# Patient Record
Sex: Female | Born: 1945 | Race: White | Hispanic: No | Smoking: Never smoker
Health system: Southern US, Community
[De-identification: ages and names within clinical notes are randomized; demographics above are authoritative.]

---

## 1999-06-05 ENCOUNTER — Other Ambulatory Visit: Admission: RE | Admit: 1999-06-05 | Discharge: 1999-06-05 | Payer: Self-pay | Admitting: Obstetrics and Gynecology

## 2000-06-10 ENCOUNTER — Other Ambulatory Visit: Admission: RE | Admit: 2000-06-10 | Discharge: 2000-06-10 | Payer: Self-pay | Admitting: Obstetrics and Gynecology

## 2001-02-04 ENCOUNTER — Ambulatory Visit (HOSPITAL_COMMUNITY): Admission: RE | Admit: 2001-02-04 | Discharge: 2001-02-04 | Payer: Self-pay | Admitting: Gastroenterology

## 2001-07-29 ENCOUNTER — Other Ambulatory Visit: Admission: RE | Admit: 2001-07-29 | Discharge: 2001-07-29 | Payer: Self-pay | Admitting: Obstetrics and Gynecology

## 2001-09-02 ENCOUNTER — Encounter: Payer: Self-pay | Admitting: Obstetrics and Gynecology

## 2001-09-02 ENCOUNTER — Encounter (INDEPENDENT_AMBULATORY_CARE_PROVIDER_SITE_OTHER): Payer: Self-pay | Admitting: Specialist

## 2001-09-02 ENCOUNTER — Ambulatory Visit (HOSPITAL_COMMUNITY): Admission: RE | Admit: 2001-09-02 | Discharge: 2001-09-02 | Payer: Self-pay | Admitting: Radiology

## 2002-10-05 ENCOUNTER — Other Ambulatory Visit: Admission: RE | Admit: 2002-10-05 | Discharge: 2002-10-05 | Payer: Self-pay | Admitting: Obstetrics and Gynecology

## 2003-11-08 ENCOUNTER — Other Ambulatory Visit: Admission: RE | Admit: 2003-11-08 | Discharge: 2003-11-08 | Payer: Self-pay | Admitting: Obstetrics and Gynecology

## 2009-05-17 ENCOUNTER — Encounter: Admission: RE | Admit: 2009-05-17 | Discharge: 2009-05-17 | Payer: Self-pay | Admitting: Internal Medicine

## 2012-05-11 ENCOUNTER — Other Ambulatory Visit: Payer: Self-pay | Admitting: Internal Medicine

## 2012-05-11 DIAGNOSIS — Z1231 Encounter for screening mammogram for malignant neoplasm of breast: Secondary | ICD-10-CM

## 2012-06-15 ENCOUNTER — Ambulatory Visit
Admission: RE | Admit: 2012-06-15 | Discharge: 2012-06-15 | Disposition: A | Discharge status: Home / Self Care | Payer: Medicare Other | Source: Ambulatory Visit | Attending: Internal Medicine | Admitting: Internal Medicine

## 2012-06-15 DIAGNOSIS — Z1231 Encounter for screening mammogram for malignant neoplasm of breast: Secondary | ICD-10-CM

## 2012-09-03 ENCOUNTER — Ambulatory Visit (INDEPENDENT_AMBULATORY_CARE_PROVIDER_SITE_OTHER): Payer: No Typology Code available for payment source | Admitting: Licensed Clinical Social Worker

## 2012-09-03 DIAGNOSIS — F4321 Adjustment disorder with depressed mood: Secondary | ICD-10-CM

## 2012-09-09 ENCOUNTER — Ambulatory Visit: Payer: Medicare Other | Admitting: Licensed Clinical Social Worker

## 2012-09-14 ENCOUNTER — Ambulatory Visit (INDEPENDENT_AMBULATORY_CARE_PROVIDER_SITE_OTHER): Payer: No Typology Code available for payment source | Admitting: Licensed Clinical Social Worker

## 2012-09-14 DIAGNOSIS — F4321 Adjustment disorder with depressed mood: Secondary | ICD-10-CM

## 2012-09-28 ENCOUNTER — Ambulatory Visit (INDEPENDENT_AMBULATORY_CARE_PROVIDER_SITE_OTHER): Payer: No Typology Code available for payment source | Admitting: Licensed Clinical Social Worker

## 2012-09-28 DIAGNOSIS — F4321 Adjustment disorder with depressed mood: Secondary | ICD-10-CM

## 2012-10-26 ENCOUNTER — Ambulatory Visit (INDEPENDENT_AMBULATORY_CARE_PROVIDER_SITE_OTHER): Payer: No Typology Code available for payment source | Admitting: Licensed Clinical Social Worker

## 2012-10-26 DIAGNOSIS — F4321 Adjustment disorder with depressed mood: Secondary | ICD-10-CM

## 2012-11-23 ENCOUNTER — Ambulatory Visit (INDEPENDENT_AMBULATORY_CARE_PROVIDER_SITE_OTHER): Payer: No Typology Code available for payment source | Admitting: Licensed Clinical Social Worker

## 2012-11-23 DIAGNOSIS — F4321 Adjustment disorder with depressed mood: Secondary | ICD-10-CM

## 2012-12-21 ENCOUNTER — Ambulatory Visit: Payer: No Typology Code available for payment source | Admitting: Licensed Clinical Social Worker

## 2012-12-23 ENCOUNTER — Ambulatory Visit (INDEPENDENT_AMBULATORY_CARE_PROVIDER_SITE_OTHER): Payer: No Typology Code available for payment source | Admitting: Licensed Clinical Social Worker

## 2012-12-23 DIAGNOSIS — F4321 Adjustment disorder with depressed mood: Secondary | ICD-10-CM

## 2014-06-21 ENCOUNTER — Other Ambulatory Visit: Payer: Self-pay | Admitting: Internal Medicine

## 2014-06-21 DIAGNOSIS — M545 Low back pain: Secondary | ICD-10-CM

## 2014-07-01 ENCOUNTER — Other Ambulatory Visit: Payer: Self-pay | Admitting: Internal Medicine

## 2014-07-01 ENCOUNTER — Ambulatory Visit: Admission: RE | Admit: 2014-07-01 | Payer: Medicare Other | Source: Ambulatory Visit

## 2014-07-01 DIAGNOSIS — M545 Low back pain: Secondary | ICD-10-CM

## 2014-07-10 ENCOUNTER — Ambulatory Visit
Admission: RE | Admit: 2014-07-10 | Discharge: 2014-07-10 | Disposition: A | Discharge status: Home / Self Care | Payer: Medicare Other | Source: Ambulatory Visit | Attending: Internal Medicine | Admitting: Internal Medicine

## 2014-07-10 DIAGNOSIS — M545 Low back pain: Secondary | ICD-10-CM

## 2015-08-04 DIAGNOSIS — H2513 Age-related nuclear cataract, bilateral: Secondary | ICD-10-CM | POA: Diagnosis not present

## 2015-08-04 DIAGNOSIS — H52223 Regular astigmatism, bilateral: Secondary | ICD-10-CM | POA: Diagnosis not present

## 2015-08-04 DIAGNOSIS — H5203 Hypermetropia, bilateral: Secondary | ICD-10-CM | POA: Diagnosis not present

## 2015-09-12 DIAGNOSIS — Z1231 Encounter for screening mammogram for malignant neoplasm of breast: Secondary | ICD-10-CM | POA: Diagnosis not present

## 2015-09-12 DIAGNOSIS — Z01419 Encounter for gynecological examination (general) (routine) without abnormal findings: Secondary | ICD-10-CM | POA: Diagnosis not present

## 2015-09-12 DIAGNOSIS — Z6835 Body mass index (BMI) 35.0-35.9, adult: Secondary | ICD-10-CM | POA: Diagnosis not present

## 2016-01-19 DIAGNOSIS — D225 Melanocytic nevi of trunk: Secondary | ICD-10-CM | POA: Diagnosis not present

## 2016-01-19 DIAGNOSIS — L821 Other seborrheic keratosis: Secondary | ICD-10-CM | POA: Diagnosis not present

## 2016-01-19 DIAGNOSIS — D1801 Hemangioma of skin and subcutaneous tissue: Secondary | ICD-10-CM | POA: Diagnosis not present

## 2016-01-19 DIAGNOSIS — D692 Other nonthrombocytopenic purpura: Secondary | ICD-10-CM | POA: Diagnosis not present

## 2016-01-19 DIAGNOSIS — L570 Actinic keratosis: Secondary | ICD-10-CM | POA: Diagnosis not present

## 2016-01-19 DIAGNOSIS — D2261 Melanocytic nevi of right upper limb, including shoulder: Secondary | ICD-10-CM | POA: Diagnosis not present

## 2016-01-19 DIAGNOSIS — L814 Other melanin hyperpigmentation: Secondary | ICD-10-CM | POA: Diagnosis not present

## 2016-02-19 DIAGNOSIS — Z23 Encounter for immunization: Secondary | ICD-10-CM | POA: Diagnosis not present

## 2016-02-28 DIAGNOSIS — M25561 Pain in right knee: Secondary | ICD-10-CM | POA: Diagnosis not present

## 2016-02-28 DIAGNOSIS — Z6835 Body mass index (BMI) 35.0-35.9, adult: Secondary | ICD-10-CM | POA: Diagnosis not present

## 2016-03-09 DIAGNOSIS — M7631 Iliotibial band syndrome, right leg: Secondary | ICD-10-CM | POA: Diagnosis not present

## 2016-03-09 DIAGNOSIS — M25561 Pain in right knee: Secondary | ICD-10-CM | POA: Diagnosis not present

## 2016-03-20 DIAGNOSIS — M7631 Iliotibial band syndrome, right leg: Secondary | ICD-10-CM | POA: Diagnosis not present

## 2016-03-27 DIAGNOSIS — M7631 Iliotibial band syndrome, right leg: Secondary | ICD-10-CM | POA: Diagnosis not present

## 2016-04-09 DIAGNOSIS — J019 Acute sinusitis, unspecified: Secondary | ICD-10-CM | POA: Diagnosis not present

## 2016-04-09 DIAGNOSIS — J302 Other seasonal allergic rhinitis: Secondary | ICD-10-CM | POA: Diagnosis not present

## 2016-04-09 DIAGNOSIS — Z6836 Body mass index (BMI) 36.0-36.9, adult: Secondary | ICD-10-CM | POA: Diagnosis not present

## 2016-04-09 DIAGNOSIS — H1033 Unspecified acute conjunctivitis, bilateral: Secondary | ICD-10-CM | POA: Diagnosis not present

## 2016-05-07 DIAGNOSIS — M7631 Iliotibial band syndrome, right leg: Secondary | ICD-10-CM | POA: Diagnosis not present

## 2016-05-07 DIAGNOSIS — M1711 Unilateral primary osteoarthritis, right knee: Secondary | ICD-10-CM | POA: Diagnosis not present

## 2016-05-23 DIAGNOSIS — E784 Other hyperlipidemia: Secondary | ICD-10-CM | POA: Diagnosis not present

## 2016-05-23 DIAGNOSIS — Z0000 Encounter for general adult medical examination without abnormal findings: Secondary | ICD-10-CM | POA: Diagnosis not present

## 2016-05-23 DIAGNOSIS — E048 Other specified nontoxic goiter: Secondary | ICD-10-CM | POA: Diagnosis not present

## 2016-05-30 DIAGNOSIS — R252 Cramp and spasm: Secondary | ICD-10-CM | POA: Diagnosis not present

## 2016-05-30 DIAGNOSIS — R7301 Impaired fasting glucose: Secondary | ICD-10-CM | POA: Diagnosis not present

## 2016-05-30 DIAGNOSIS — E048 Other specified nontoxic goiter: Secondary | ICD-10-CM | POA: Diagnosis not present

## 2016-05-30 DIAGNOSIS — E784 Other hyperlipidemia: Secondary | ICD-10-CM | POA: Diagnosis not present

## 2016-05-30 DIAGNOSIS — Z1389 Encounter for screening for other disorder: Secondary | ICD-10-CM | POA: Diagnosis not present

## 2016-05-30 DIAGNOSIS — Z0000 Encounter for general adult medical examination without abnormal findings: Secondary | ICD-10-CM | POA: Diagnosis not present

## 2016-05-30 DIAGNOSIS — M25561 Pain in right knee: Secondary | ICD-10-CM | POA: Diagnosis not present

## 2016-05-30 DIAGNOSIS — Z6835 Body mass index (BMI) 35.0-35.9, adult: Secondary | ICD-10-CM | POA: Diagnosis not present

## 2016-06-11 DIAGNOSIS — Z1212 Encounter for screening for malignant neoplasm of rectum: Secondary | ICD-10-CM | POA: Diagnosis not present

## 2016-07-11 DIAGNOSIS — M1711 Unilateral primary osteoarthritis, right knee: Secondary | ICD-10-CM | POA: Diagnosis not present

## 2016-08-05 NOTE — Progress Notes (Signed)
Tawana ScaleZach Fermin Yan D.O. [facility] [address] [place], [place] [address] Phone: [phone] Subjective:     CC: Chronic knee pain  WGN:[id number]:Subjective  Mariah BroodMary Rosario is a 71 y.o. female coming in with complaint of knee pain right sided,  Been going on for a year.  Patient is seen other providers previously for this. Was diagnosed with iliotibial band syndrome. Patient did do some exercises but never seemed to completely resolve it. Patient denies radiation of the knee but unfortunately continues to have pain. Sometimes does have some increasing instability. Patient states that unfortunately when she plays tennis she knows is more discomfort. 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  rates the severity of pain a 7 out of 10.  No past medical history on file. No past surgical history on file. Social History   Social History  . Marital status: Married    Spouse name: N/A  . Number of children: N/A  . Years of education: N/A   Social History Main Topics  . Smoking status: Never Smoker  . Smokeless tobacco: Never Used  . Alcohol use None  . Drug use: Unknown  . Sexual activity: Not Asked   Other Topics Concern  . None   Social History Narrative  . None   No Known Allergies No family history on file. No family history rheumatological diseases.  Past medical history, social, surgical and family history all reviewed in electronic medical record.  No pertanent information unless stated regarding to the chief complaint.   Review of Systems:Review of systems updated and as accurate as of 08/06/16  No headache, visual changes, nausea, vomiting, diarrhea, constipation, dizziness, abdominal pain, skin rash, fevers, chills, night sweats, weight loss, swollen lymph nodes, body aches, joint swelling, muscle aches, chest pain, shortness of breath, mood changes.   Objective  Blood pressure 130/82, pulse 94, height 5\' 2"   (1.575 m), weight 200 lb (90.7 kg), SpO2 98 %. Systems examined below as of 08/06/16   General: No apparent distress alert and oriented x3 mood and affect normal, dressed appropriately.  HEENT: Pupils equal, extraocular movements intact  Respiratory: Patient's speak in full sentences and does not appear short of breath  Cardiovascular: No lower extremity edema, non tender, no erythema  Skin: Warm dry intact with no signs of infection or rash on extremities or on axial skeleton.  Abdomen: Soft nontender  Neuro: Cranial nerves II through XII are intact, neurovascularly intact in all extremities with 2+ DTRs and 2+ pulses.  Lymph: No lymphadenopathy of posterior or anterior cervical chain or axillae bilaterally.  Gait normal with good balance and coordination.  MSK:  Non tender with full range of motion and good stability and symmetric strength and tone of shoulders, elbows, wrist, hip, and ankles bilaterally.  Knee: Right valgus deformity noted. Large thigh to calf ratio.  Tender to palpation over medial and PF joint line.  ROM full in flexion and extension and lower leg rotation. instability with valgus force.  Positive McMurray's painful patellar compression. Patellar glide with moderate crepitus. Patellar and quadriceps tendons unremarkable. Hamstring and quadriceps strength is normal. Contralateral knee shows mild degenerative changes as well.  MSK US performed of: Right knee This study was ordered, performed, and interpreted by Terrilee FilesZach Vannah Nadal D.O.  Knee: All structures visualized. Patient does have a large displaced lateral meniscal tear noted with a. Meniscal cyst. Seems to be proximal a 60% of the posterior aspect. Moderate  to severe narrowing of the medial joint line Patellar Tendon unremarkable on long and transverse views without effusion. LCL and MCL unremarkable on long and transverse views.   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   Procedure note 97110; 15 minutes spent for Therapeutic exercises as stated in above notes.  This included exercises focusing on stretching, strengthening, with significant focus on eccentric aspects.  Fox and extension exercises working on the vastus medialis oblique exercises as well as hip abductor strengthening in eccentric. Proper technique shown and discussed handout in great detail with ATC.  All questions were discussed and answered.     Impression and Recommendations:     This case required medical decision making of moderate complexity.      Note: This dictation was prepared with Dragon dictation along with smaller phrase technology. Any transcriptional errors that result from this process are unintentional.

## 2016-08-06 ENCOUNTER — Encounter: Payer: Self-pay | Admitting: Family Medicine

## 2016-08-06 ENCOUNTER — Ambulatory Visit: Payer: Self-pay

## 2016-08-06 ENCOUNTER — Ambulatory Visit (INDEPENDENT_AMBULATORY_CARE_PROVIDER_SITE_OTHER): Payer: Medicare Other | Admitting: Family Medicine

## 2016-08-06 VITALS — BP 130/82 | HR 94 | Ht 62.0 in | Wt 200.0 lb

## 2016-08-06 DIAGNOSIS — M25561 Pain in right knee: Secondary | ICD-10-CM | POA: Diagnosis not present

## 2016-08-06 DIAGNOSIS — M1711 Unilateral primary osteoarthritis, right knee: Secondary | ICD-10-CM | POA: Diagnosis not present

## 2016-08-06 DIAGNOSIS — S83289A Other tear of lateral meniscus, current injury, unspecified knee, initial encounter: Secondary | ICD-10-CM | POA: Insufficient documentation

## 2016-08-06 DIAGNOSIS — G8929 Other chronic pain: Secondary | ICD-10-CM

## 2016-08-06 DIAGNOSIS — S83281A Other tear of lateral meniscus, current injury, right knee, initial encounter: Secondary | ICD-10-CM

## 2016-08-06 NOTE — Assessment & Plan Note (Signed)
Home exercise program, discussed which activities to avoid, we discussed icing regimen and topical anti-inflammatories follow-up in 4 weeks. If any worsening in. Meniscal cyst possible aspiration.

## 2016-08-06 NOTE — Patient Instructions (Signed)
Good to see you.  Ice 20 minutes 2 times daily. Usually after activity and before bed. Exercises 3 times a week.  pennsaid pinkie amount topically 2 times daily as needed.  Vitamin D 2000 IU daily  Turmeric 500mg  1-2 times daily  Tart cherry extract any dose at night  OK to play tennis but be careful  See me again in 4 weeks.

## 2016-08-06 NOTE — Assessment & Plan Note (Signed)
Patient does have degenerative arthritis of the right knee mostly of the medial compartment. We discussed with patient about home exercise, icing protocol, which activities doing which ones to avoid. Patient declined any type of injection of bracing N/A. Declined any formal physical therapy. Discussed over-the-counter medications and given topical medications to try. Follow-up again in 4 weeks

## 2016-09-05 ENCOUNTER — Encounter: Payer: Self-pay | Admitting: Family Medicine

## 2016-09-05 ENCOUNTER — Ambulatory Visit (INDEPENDENT_AMBULATORY_CARE_PROVIDER_SITE_OTHER): Payer: Medicare Other | Admitting: Family Medicine

## 2016-09-05 DIAGNOSIS — M76899 Other specified enthesopathies of unspecified lower limb, excluding foot: Secondary | ICD-10-CM

## 2016-09-05 DIAGNOSIS — M1711 Unilateral primary osteoarthritis, right knee: Secondary | ICD-10-CM

## 2016-09-05 NOTE — Patient Instructions (Signed)
Good to see you  New exercises for the hip flexors.  The knee is doing great.  S[employer] orthotics "total support" online would be great  Try to wlak on softer surfaces and flat.  Drink 2 glasses of water immediately when you wake up eat every 2 hours and no carbs after 6pm.  I think you are doing great  See me again in 4 weeks.

## 2016-09-05 NOTE — Assessment & Plan Note (Signed)
Seems to be doing well at this time. No significant changes and treatment at this time. Patient will come back any worsening symptoms we'll consider injection. Otherwise patient can follow-up in 6-12 weeks.

## 2016-09-05 NOTE — Progress Notes (Signed)
Tawana ScaleZach Obinna Ehresman D.O. [facility] [address] [place], [place] [address] Phone: [phone] Subjective:     CC: Chronic knee pain f/u  ION:[id number]:Subjective  Mariah BroodMary Mcclees is a 71 y.o. female coming in with complaint of knee pain right sided,  Patient was seen previously for weeks ago. Was found to have a meniscal tear. States that she is doing the ballpark and 90% better. Not having as much pain or swelling. An states that she has been able to start increasing activity.   Patient has been starting to increase walking. Has noticed bilateral hip pain. Seems to be more in the groin area. Patient states going up or downhill seems to be worse. Patient denies pain with stairs. Denies any radiation down the leg. Mild increasing lower back pain. Denies any numbness or weakness. Gets better with rest.  No past medical history on file. No past surgical history on file. Social History   Social History  . Marital status: Married    Spouse name: N/A  . Number of children: N/A  . Years of education: N/A   Social History Main Topics  . Smoking status: Never Smoker  . Smokeless tobacco: Never Used  . Alcohol use Not on file  . Drug use: Unknown  . Sexual activity: Not on file   Other Topics Concern  . Not on file   Social History Narrative  . No narrative on file   No Known Allergies No family history on file. No family history rheumatological diseases.  Past medical history, social, surgical and family history all reviewed in electronic medical record.  No pertanent information unless stated regarding to the chief complaint.   Review of Systems: No headache, visual changes, nausea, vomiting, diarrhea, constipation, dizziness, abdominal pain, skin rash, fevers, chills, night sweats, weight loss, swollen lymph nodes, body aches, joint swelling, muscle aches, chest pain, shortness of breath, mood changes.    Objective  There were no vitals taken for this visit. Systems examined  below as of 09/05/16   Systems examined below as of 09/05/16 General: NAD A&O x3 mood, affect normal  HEENT: Pupils equal, extraocular movements intact no nystagmus Respiratory: not short of breath at rest or with speaking Cardiovascular: No lower extremity edema, non tender Skin: Warm dry intact with no signs of infection or rash on extremities or on axial skeleton. Abdomen: Soft nontender, no masses Neuro: Cranial nerves  intact, neurovascularly intact in all extremities with 2+ DTRs and 2+ pulses. Lymph: No lymphadenopathy appreciated today  Gait normal with good balance and coordination.  MSK: Non tender with full range of motion and good stability and symmetric strength and tone of shoulders, elbows, wrist,  and ankles bilaterally.  Arthritic changes of multiple joints  Knee: Right valgus deformity noted. Large thigh to calf ratio.  Minimal tenderness still remaining.  ROM full in flexion and extension and lower leg rotation. Increased ability compared to previous exam.  Positive McMurray's painful patellar compression. Patellar glide with moderate crepitus. Patellar and quadriceps tendons unremarkable. Hamstring and quadriceps strength is normal. Contralateral knee shows mild degenerative changes as well.  Bilateral hip exam shows the patient does have near full range of motion with mild tightness of the hip flexors. Maybe mild lack of internal rotation. Patient has full strength. Neurovascular intact distally.    Impression and Recommendations:     This case required medical decision making of moderate complexity.      Note: This dictation was prepared with Dragon dictation along with  smaller phrase technology. Any transcriptional errors that result from this process are unintentional.

## 2016-09-05 NOTE — Assessment & Plan Note (Signed)
Bilateral. Patient does have known mild spinal stenosis with MRI previously. States that this does not feel the same. Seems to be only tightness with the hip flexors with no radicular symptoms. Patient could have potential mild hip arthritis but I do not feel further workup is necessary. We discussed exercises and patient work with Event organiserathletic trainer. We discussed which activities to avoid. Patient come back and see me again 3-4 weeks.

## 2016-09-12 ENCOUNTER — Ambulatory Visit: Payer: Self-pay | Admitting: Sports Medicine

## 2016-09-26 DIAGNOSIS — Z6836 Body mass index (BMI) 36.0-36.9, adult: Secondary | ICD-10-CM | POA: Diagnosis not present

## 2016-09-26 DIAGNOSIS — Z1231 Encounter for screening mammogram for malignant neoplasm of breast: Secondary | ICD-10-CM | POA: Diagnosis not present

## 2016-09-26 DIAGNOSIS — Z01419 Encounter for gynecological examination (general) (routine) without abnormal findings: Secondary | ICD-10-CM | POA: Diagnosis not present

## 2016-10-02 NOTE — Progress Notes (Signed)
[facility] [address] [place], [place] [address] Phone: [phone] Subjective:     CC: Chronic knee pain f/u  OZH:[id number]  Mariah Rosario is a 71 y.o. female coming in with complaint of knee pain right sided,  Patient did have what appeared to be a lateral meniscal tear as well as some medial aspect of arthritis of the knee but was doing significantly better at last follow-up. Patient states Still doing very well at this time. Very minimal catching sensation from time to time but still now 90% better  Patient was having bilateral hip pain at last visit was diagnosed with more of a bilateral tendinitis. Given different exercises. Patient states doing much better. Trying to stay active. Still noticed some tightness after playing tennis.  No past medical history on file. No past surgical history on file. Social History   Social History  . Marital status: Married    Spouse name: N/A  . Number of children: N/A  . Years of education: N/A   Social History Main Topics  . Smoking status: Never Smoker  . Smokeless tobacco: Never Used  . Alcohol use Not on file  . Drug use: Unknown  . Sexual activity: Not on file   Other Topics Concern  . Not on file   Social History Narrative  . No narrative on file   Allergies  Allergen Reactions  . Codeine Nausea Only   No family history on file. No family history rheumatological diseases.  Past medical history, social, surgical and family history all reviewed in electronic medical record.  No pertanent information unless stated regarding to the chief complaint.   Review of Systems: No headache, visual changes, nausea, vomiting, diarrhea, constipation, dizziness, abdominal pain, skin rash, fevers, chills, night sweats, weight loss, swollen lymph nodes, body aches, joint swelling, muscle aches, chest pain, shortness of breath, mood changes.       Objective  Blood pressure 132/72, pulse 79, resp. rate 16,  weight 197 lb 6 oz (89.5 kg), SpO2 98 %.   Systems examined below as of 10/03/16 General: NAD A&O x3 mood, affect normal  HEENT: Pupils equal, extraocular movements intact no nystagmus Respiratory: not short of breath at rest or with speaking Cardiovascular: No lower extremity edema, non tender Skin: Warm dry intact with no signs of infection or rash on extremities or on axial skeleton. Abdomen: Soft nontender, no masses Neuro: Cranial nerves  intact, neurovascularly intact in all extremities with 2+ DTRs and 2+ pulses. Lymph: No lymphadenopathy appreciated today  Gait normal with good balance and coordination.  MSK: Non tender with full range of motion and good stability and symmetric strength and tone of shoulders, elbows, wrist,  and ankles bilaterally.  Arthritic changes of multiple joints  Knee: Right valgus deformity noted. Large thigh to calf ratio.  Minimal tenderness still remaining.  ROM full in flexion and extension and lower leg rotation. Increased ability compared to previous exam.  Negative McMurray's which is improvement painful patellar compression. Patellar glide with moderate crepitus. Patellar and quadriceps tendons unremarkable. Hamstring and quadriceps strength is normal. Contralateral knee shows mild degenerative changes as well.  Bilateral hip exam shows the patient does have near full range of motion and no significant tightness at this time.    Impression and Recommendations:     This case required medical decision making of moderate complexity.      Note: This dictation was prepared with Dragon dictation along with smaller phrase technology. Any transcriptional  errors that result from this process are unintentional.

## 2016-10-03 ENCOUNTER — Encounter: Payer: Self-pay | Admitting: Family Medicine

## 2016-10-03 ENCOUNTER — Ambulatory Visit (INDEPENDENT_AMBULATORY_CARE_PROVIDER_SITE_OTHER): Payer: Medicare Other | Admitting: Family Medicine

## 2016-10-03 DIAGNOSIS — M76899 Other specified enthesopathies of unspecified lower limb, excluding foot: Secondary | ICD-10-CM

## 2016-10-03 DIAGNOSIS — S83281A Other tear of lateral meniscus, current injury, right knee, initial encounter: Secondary | ICD-10-CM

## 2016-10-03 NOTE — Patient Instructions (Addendum)
Good to see you  Mariah Rosario is your friend.  Keep doing the exercises 2-3 times a week.,  We will call you when the orthotics are in, I think they will do great  Stay active but stretch after tennis  After you get the orthotic lets see you again in 4 weeks

## 2016-10-03 NOTE — Progress Notes (Signed)
Pre-visit discussion using our clinic review tool. No additional management support is needed unless otherwise documented below in the visit note.  

## 2016-10-03 NOTE — Assessment & Plan Note (Signed)
Patient does have significant improvement at this time. Encourage her to try to increase activity as tolerated. If any worsening symptoms come back but I think patient will do well with conservative therapy.

## 2016-10-03 NOTE — Assessment & Plan Note (Signed)
Stable

## 2016-10-09 ENCOUNTER — Ambulatory Visit (INDEPENDENT_AMBULATORY_CARE_PROVIDER_SITE_OTHER): Payer: Medicare Other | Admitting: Family Medicine

## 2016-10-09 DIAGNOSIS — R269 Unspecified abnormalities of gait and mobility: Secondary | ICD-10-CM | POA: Diagnosis not present

## 2016-10-09 NOTE — Progress Notes (Signed)
Patient was fitted for a : Igli Comfort, standard, cushioned, semi-rigid orthotic. The orthotic was heated and afterward the patient was in a seated position and the orthotic molded. The patient was positioned in subtalar neutral position and 10 degrees of ankle dorsiflexion in a non-weight bearing stance. After completion of molding, patient did have orthotic management which included instructions on acclimating to the orthotics, signs of ill fit as well as care for the orthotic.   The blank was ground to a stable position for weight bearing. Size: Women's 7.5 Base: Carbon fiber Additional Posting and Padding: Right foot: 250/30x3 lateral side foot and Left: 250x30x2 lateral side of foot to The following postings were fitted onto the molded orthotics to help maintain a talar neutral position - Wedge posting for transverse arch: none The patient ambulated these, and they were very comfortable and supportive.

## 2016-10-09 NOTE — Assessment & Plan Note (Signed)
Patient has significant breakdown of the longitudinal as well as transverse arch giving her a gait abnormality. Placed in custom orthotics today. Likely will respond fairly well to conservative therapy. We discussed icing regimen, avoiding being barefoot, we discussed slowly increasing wear over the course of time in follow-up again in 4 weeks.

## 2016-10-23 ENCOUNTER — Ambulatory Visit (INDEPENDENT_AMBULATORY_CARE_PROVIDER_SITE_OTHER): Payer: Medicare Other

## 2016-10-23 ENCOUNTER — Ambulatory Visit (INDEPENDENT_AMBULATORY_CARE_PROVIDER_SITE_OTHER): Payer: Medicare Other | Admitting: Sports Medicine

## 2016-10-23 ENCOUNTER — Telehealth: Payer: Self-pay | Admitting: Sports Medicine

## 2016-10-23 ENCOUNTER — Encounter: Payer: Self-pay | Admitting: Sports Medicine

## 2016-10-23 VITALS — BP 100/70 | HR 97 | Ht 62.0 in | Wt 188.0 lb

## 2016-10-23 DIAGNOSIS — S93621D Sprain of tarsometatarsal ligament of right foot, subsequent encounter: Secondary | ICD-10-CM | POA: Insufficient documentation

## 2016-10-23 DIAGNOSIS — M79671 Pain in right foot: Secondary | ICD-10-CM

## 2016-10-23 DIAGNOSIS — S93622A Sprain of tarsometatarsal ligament of left foot, initial encounter: Secondary | ICD-10-CM | POA: Diagnosis not present

## 2016-10-23 DIAGNOSIS — R269 Unspecified abnormalities of gait and mobility: Secondary | ICD-10-CM

## 2016-10-23 NOTE — Progress Notes (Signed)
OFFICE VISIT NOTE Mariah Rosario. [facility] [phone]  Mariah Rosario - 71 y.o. female MRN [id number]  Date of birth: 1945-08-21  Visit Date: 10/23/2016  PCP: Patient, No Pcp Per   Referred by: No ref. provider found Autumn Mcneil, cma acting as scribe for Dr. Berline Chough.  SUBJECTIVE:   Chief Complaint  Patient presents with  . NP: Right Foot Pain   HPI: As below and per problem based documentation when appropriate.  Mariah Rosario presents today with a RT foot injury. She was playing tennis, pushed off with her right foot and felt a pain in her in step. There has been a constant dull pain since incicdent with new onset of later pain in foot as well. Pt applied ice for 10 minutes with some relief. She is able to pick up her foot to walk but not bend it.     ROS  Otherwise per HPI.  HISTORY & PERTINENT PRIOR DATA:  No specialty comments available. She reports that she has never smoked. She has never used smokeless tobacco. No results for input(s): HGBA1C, LABURIC in the last 8760 hours. Medications & Allergies reviewed per EMR Patient Active Problem List   Diagnosis Date Noted  . Gait abnormality 10/09/2016  . Hip flexor tendinitis 09/05/2016  . Degenerative arthritis of right knee 08/06/2016  . Lateral meniscus tear 08/06/2016   History reviewed. No pertinent past medical history. History reviewed. No pertinent family history. History reviewed. No pertinent surgical history. Social History   Occupational History  . Not on file.   Social History Main Topics  . Smoking status: Never Smoker  . Smokeless tobacco: Never Used  . Alcohol use Not on file  . Drug use: Unknown  . Sexual activity: Not on file    OBJECTIVE:  VS:  HT:5\' 2"  (157.5 cm)   WT:188 lb (85.3 kg)  BMI:34.5    BP:100/70  HR:97bpm  TEMP: ( )  RESP:98 % Physical Exam Ortho Exam Findings:  WDWN, NAD, Non-toxic appearing Alert & appropriately  interactive Not depressed or anxious appearing No increased work of breathing. Pupils are equal. EOM intact without nystagmus No clubbing or cyanosis of the extremities appreciated No significant rashes/lesions/ulcerations overlying the examined area. DP & PT pulses 2+/4.  No significant pretibial edema.  No clubbing or cyanosis Sensation intact to light touch in lower extremities.  Right foot: Marked pain palpation of the midfoot.  Minimal osteoarthritic bossing.  No significant pain over the calcaneus.  No pain over the peroneal tendons or the anterior/posterior tibial tendons.  She has difficult time with weightbearing.  Marked pain with forefoot abduction.  Stable anterior drawer, talar tilt and cotton testing.  Dg Foot Complete Right  Result Date: 10/23/2016 CLINICAL DATA:  Sharp pain in RIGHT foot. EXAM: RIGHT FOOT COMPLETE - 3+ VIEW COMPARISON:  None. FINDINGS: No fracture or dislocation of mid foot or forefoot. The phalanges are normal. The calcaneus is normal. No soft tissue abnormality. Enthesopathic spurring along the plantar aspect of the calcaneus. IMPRESSION: No fracture or dislocation. Electronically Signed   By: [name] M.D.   On: 10/23/2016 12:40    ASSESSMENT & PLAN:   Problem List Items Addressed This Visit    Gait abnormality   Lisfranc's sprain, right, subsequent encounter    Symptoms are consistent with a right midfoot sprain concern for Lisfranc injury. Symptomatic treatment with a cam walker boot and minimizing weightbearing.  She has a walker at  home. Anti-inflammatories OTC, ice and elevation. Can consider compression.   Will need discuss advancing weightbearing and follow-up.       Other Visit Diagnoses    Right foot pain    -  Primary   Relevant Orders   DG Foot Complete Right (Completed)      Follow-up: Return in about 2 weeks (around 11/06/2016).   CMA/ATC served as Neurosurgeon during this visit. History, Physical, and Plan performed by medical  provider. Documentation and orders reviewed and attested to.      [name], DO    [facility] Phys[facility]

## 2016-10-23 NOTE — Telephone Encounter (Signed)
Patient called inquiring about RX (patient could not recall name of RX) that she was prescribed today. Patient said her pharmacy has not received it yet. Please call patient and advise.

## 2016-10-24 NOTE — Telephone Encounter (Signed)
I reviewed the note but did not see anything discussed about a medication. Please advise.

## 2016-11-06 ENCOUNTER — Ambulatory Visit: Payer: Medicare Other | Admitting: Sports Medicine

## 2016-11-11 ENCOUNTER — Ambulatory Visit (INDEPENDENT_AMBULATORY_CARE_PROVIDER_SITE_OTHER): Payer: Medicare Other | Admitting: Sports Medicine

## 2016-11-11 ENCOUNTER — Encounter: Payer: Self-pay | Admitting: Sports Medicine

## 2016-11-11 VITALS — BP 130/82 | HR 82 | Ht 62.0 in | Wt 187.6 lb

## 2016-11-11 DIAGNOSIS — R269 Unspecified abnormalities of gait and mobility: Secondary | ICD-10-CM | POA: Diagnosis not present

## 2016-11-11 DIAGNOSIS — S93621D Sprain of tarsometatarsal ligament of right foot, subsequent encounter: Secondary | ICD-10-CM | POA: Diagnosis not present

## 2016-11-11 DIAGNOSIS — M1711 Unilateral primary osteoarthritis, right knee: Secondary | ICD-10-CM | POA: Diagnosis not present

## 2016-11-11 MED ORDER — CELECOXIB 100 MG PO CAPS: 100.0000 mg | ORAL_CAPSULE | Freq: Two times a day (BID) | ORAL | 2 refills | Status: DC

## 2016-11-11 NOTE — Assessment & Plan Note (Signed)
This is done remarkably well.  She has had significant improvement over the past several weeks we will allow her to begin working on weaning out of the boot and into the postoperative shoe that was provided today.  She should continue to minimize her activity based on symptoms but if persistent improvement she will be able to increase as tolerated and hopefully discontinue the boot after he had shoe after 2 additional weeks of immobilization.  Would like to work on getting her out of this is a does seem to be bothering her knee and altering her gait.  Any lack of improvement with her knee can consider injection.  Trial of Celebrex to see if this is beneficial for both of these issues.

## 2016-11-11 NOTE — Progress Notes (Signed)
OFFICE VISIT NOTE Mariah FellsMichael D. Rosario Shinerigby, DO  [facility] [phone]  Mariah BroodMary Rosario - 71 y.o. female MRN [id number]  Date of birth: [birth year]  Visit Date: 11/11/2016  PCP: Patient, No Pcp Per   Referred by: No ref. provider found  Mariah DakinBrandy Rosario, CMA acting as scribe for Dr. Berline Choughigby.  SUBJECTIVE:   Chief Complaint  Patient presents with  . Follow-up    Lisfranc's sprain   HPI: As below and per problem based documentation when appropriate.  Pt presents today for follow up of right foot injury, Lisfranc's sprain Injury occurred 2.5 weeks ago.  She was playing tennis, pushed off with her right foot and felt a pain in her in step  The pain is described as throbbing when sleep, mostly in toes and lateral ankle and is rated as 6/10 at night but 4/10 during the day. The ball of both of her feet are starting to hurt when she mobile and at rest.   Worsened with going up and down stairs. The bending motions seems to make it worse.  Improves with Tylenol Therapies tried include Tylenol, boot  Other associated symptoms include: anterior right knee pain when walking around. She is using Voltaren gel on her knee and getting some relief from that. She does not have a current prescription.   Pt. Denies fever, chills, night sweats.      Review of Systems  Constitutional: Negative.   HENT: Positive for sinus pain.   Eyes: Negative.   Respiratory: Negative.   Cardiovascular: Positive for leg swelling (swelling in right knee, swelling in right ankle).  Gastrointestinal: Negative.   Genitourinary: Negative.   Musculoskeletal: Positive for joint pain. Negative for falls.  Skin: Negative.   Neurological: Positive for headaches. Negative for dizziness and tingling.  Endo/Heme/Allergies: Positive for environmental allergies. Bruises/bleeds easily.  Psychiatric/Behavioral: Positive for depression (currently dealing with a stressful family  issue). The patient has insomnia (wakes up d/t pain in right foot).     Otherwise per HPI.  HISTORY & PERTINENT PRIOR DATA:  No specialty comments available. She reports that she has never smoked. She has never used smokeless tobacco. No results for input(s): HGBA1C, LABURIC in the last 8760 hours. Medications & Allergies reviewed per EMR Patient Active Problem List   Diagnosis Date Noted  . Lisfranc's sprain, right, subsequent encounter 10/23/2016  . Gait abnormality 10/09/2016  . Hip flexor tendinitis 09/05/2016  . Degenerative arthritis of right knee 08/06/2016  . Lateral meniscus tear 08/06/2016   No past medical history on file. No family history on file. No past surgical history on file. Social History   Occupational History  . Not on file.   Social History Main Topics  . Smoking status: Never Smoker  . Smokeless tobacco: Never Used  . Alcohol use Not on file  . Drug use: Unknown  . Sexual activity: Not on file    OBJECTIVE:  VS:  HT:5\' 2"  (157.5 cm)   WT:187 lb 9.6 oz (85.1 kg)  BMI:34.4    BP:130/82  HR:82bpm  TEMP: ( )  RESP:96 % EXAM: Findings:  WDWN, NAD, Non-toxic appearing Alert & appropriately interactive Not depressed or anxious appearing No increased work of breathing. Pupils are equal. EOM intact without nystagmus No clubbing or cyanosis of the extremities appreciated No significant rashes/lesions/ulcerations overlying the examined area. DP & PT pulses 2+/4.  No significant pretibial edema.  No clubbing or cyanosis Sensation intact to light touch in lower extremities.  Right foot: Overall well aligned.  Some midfoot bossing that is mild.  She has a small amount of stiffness with ankle dorsiflexion, plantarflexion inversion eversion strength is intact.  No significant retrocalcaneal swelling.  Achilles tendon is intact.  She has only very mild pain across the dorsum of the midfoot.  Only mild pain with forefoot abduction.  This is  minimal. Dorsiflexion to 95  Right knee: Mild osteoarthritic bossing without significant effusion.  Stable to varus and valgus strain with only 2-3 mm opening with valgus testing.  No pain with this today.  Only mild medial and lateral joint line pain.     Dg Foot Complete Right  Result Date: 10/23/2016 CLINICAL DATA:  Sharp pain in RIGHT foot. EXAM: RIGHT FOOT COMPLETE - 3+ VIEW COMPARISON:  None. FINDINGS: No fracture or dislocation of mid foot or forefoot. The phalanges are normal. The calcaneus is normal. No soft tissue abnormality. Enthesopathic spurring along the plantar aspect of the calcaneus. IMPRESSION: No fracture or dislocation. Electronically Signed   By: Mariah Rosario M.D.   On: 10/23/2016 12:40   ASSESSMENT & PLAN:   Problem List Items Addressed This Visit    Degenerative arthritis of right knee   Relevant Medications   celecoxib (CELEBREX) 100 MG capsule   Gait abnormality - Primary   Lisfranc's sprain, right, subsequent encounter    This is done remarkably well.  She has had significant improvement over the past several weeks we will allow her to begin working on weaning out of the boot and into the postoperative shoe that was provided today.  She should continue to minimize her activity based on symptoms but if persistent improvement she will be able to increase as tolerated and hopefully discontinue the boot after he had shoe after 2 additional weeks of immobilization.  Would like to work on getting her out of this is a does seem to be bothering her knee and altering her gait.  Any lack of improvement with her knee can consider injection.  Trial of Celebrex to see if this is beneficial for both of these issues.         Follow-up: Return in about 4 weeks (around 12/09/2016).   CMA/ATC served as Neurosurgeon during this visit. History, Physical, and Plan performed by medical provider. Documentation and orders reviewed and attested to.      Mariah Bidding, DO    Mariah Rosario  Sports Medicine Physician

## 2016-11-11 NOTE — Patient Instructions (Signed)
Transition into using the postoperative shoe around the home.  In 2 weeks I am okay with you stopping use of the boot if you no longer have any pain but you are having persistent symptoms please continue to use both of these until I see you back in 4 weeks.  It is okay to give us a call in 2 weeks to let us know how you are feeling and if you are completely better at that time we will talk about discontinuing both of these.

## 2016-11-12 ENCOUNTER — Telehealth: Payer: Self-pay | Admitting: Sports Medicine

## 2016-11-12 NOTE — Telephone Encounter (Signed)
[facility] stating that the medication celecoxib (CELEBREX) 100 MG capsule has been approved for a year for patient, affective today. Supposed to be receiving a fax with this info.

## 2016-11-12 NOTE — Telephone Encounter (Signed)
noted 

## 2016-11-18 NOTE — Assessment & Plan Note (Signed)
Symptoms are consistent with a right midfoot sprain concern for Lisfranc injury. Symptomatic treatment with a cam walker boot and minimizing weightbearing.  She has a walker at home. Anti-inflammatories OTC, ice and elevation. Can consider compression.   Will need discuss advancing weightbearing and follow-up.

## 2016-12-10 ENCOUNTER — Ambulatory Visit (INDEPENDENT_AMBULATORY_CARE_PROVIDER_SITE_OTHER): Payer: Medicare Other | Admitting: Sports Medicine

## 2016-12-10 ENCOUNTER — Ambulatory Visit: Payer: Self-pay

## 2016-12-10 ENCOUNTER — Encounter: Payer: Self-pay | Admitting: Sports Medicine

## 2016-12-10 DIAGNOSIS — S93621D Sprain of tarsometatarsal ligament of right foot, subsequent encounter: Secondary | ICD-10-CM | POA: Diagnosis not present

## 2016-12-10 DIAGNOSIS — G8929 Other chronic pain: Secondary | ICD-10-CM

## 2016-12-10 DIAGNOSIS — M25561 Pain in right knee: Secondary | ICD-10-CM

## 2016-12-10 DIAGNOSIS — R269 Unspecified abnormalities of gait and mobility: Secondary | ICD-10-CM

## 2016-12-10 DIAGNOSIS — M1711 Unilateral primary osteoarthritis, right knee: Secondary | ICD-10-CM | POA: Diagnosis not present

## 2016-12-10 NOTE — Assessment & Plan Note (Signed)
This has been exacerbated by wearing a boot and postoperative shoe.  Injection performed today.  She should begin a slow return to tennis and avoid any significant twisting activities.++++++++++++++++++++++++++++++++++++++++++++  PROCEDURE NOTE - ULTRASOUND GUIDED ASPIRATION & INJECTION: Right Knee Images were obtained and interpreted by myself, Gaspar BiddingMichael Deziya Amero, DO  Images have been saved and stored to PACS system. Images obtained on: GE S7 Ultrasound machine  ULTRASOUND FINDINGS: Small supraphysiologic effusion with degenerative bulging of the medial meniscus with spurring and small prior avulsion of the MCL versus loose body in the medial gutter.  DESCRIPTION OF PROCEDURE:  The patient's clinical condition is marked by substantial pain and/or significant functional disability. Other conservative therapy has not provided relief, is contraindicated, or not appropriate. There is a reasonable likelihood that injection will significantly improve the patient's pain and/or functional impairment. After discussing the risks, benefits and expected outcomes of the injection and all questions were reviewed and answered, the patient wished to undergo the above named procedure. Verbal consent was obtained. The ultrasound was used to identify the target structure and adjacent neurovascular structures. The skin was then prepped in sterile fashion and the target structure was injected under direct visualization using sterile technique as below: PREP: Alcohol, Ethel Chloride APPROACH: Superiolateral, stopcock technique, 25g 1.5" needle INJECTATE: 2cc 1% lidocaine, 1 cc 40mg  DepoMedrol ASPIRATE: N/A DRESSING: Band-Aid   Post procedural instructions including recommending icing and warning signs for infection were reviewed. This procedure was well tolerated and there were no complications.   IMPRESSION: Succesful US Guided Aspiration & injection

## 2016-12-10 NOTE — Assessment & Plan Note (Signed)
She still has mild pain with resisted foot abduction.  Small metatarsal cookie added to her shoes today.  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  We discussed appropriate cushioning for her tennis shoes and activity shoes as well as working on ankle range of motion.  We will plan to see her back in 4 weeks to ensure that she is return activities as tolerated and plan to assess her gait at that time with the IGLI's and her tennis shoes.  Could consider [employer] Orthotics for increased cushioning if persistent symptoms.

## 2016-12-10 NOTE — Progress Notes (Signed)
OFFICE VISIT NOTE Mariah Rosario. [facility] [phone]  Mariah Rosario - 71 y.o. female MRN [id number]  Date of birth: 02-14-1946  Visit Date: 12/10/2016  PCP: Patient, No Pcp Per   Referred by: No ref. provider found  Clovis Cao, cma acting as scribe for Dr. Berline Chough.  SUBJECTIVE:   Chief Complaint  Patient presents with  . Follow-up    degenerative arthritis of the right knee, lisfrancs sprain, right   HPI: As below and per problem based documentation when appropriate.   Degenerative arthritis of the right knee: Improved when taking Celebrex 100mg  1 tablet twice per day. Pain resolved so she d/c taking medication. Now her pain is recurrent with worsening swelling on the patella.   Lisfranc's sprain right: Imaging done 10/23/2016-[facility]. Has not weaned off the boot yet--wears mostly for support. She is able to walk without it with no pain.  Since she has been wearing boot on her RT foot she has noticed that the bottom of her left foot is now bothering her. Pt is wearing comfortable shoes.     Review of Systems  Constitutional: Negative for chills, diaphoresis, fever, malaise/fatigue and weight loss.  HENT: Negative.   Eyes: Negative.   Respiratory: Negative.  Negative for cough.   Cardiovascular: Negative.  Negative for chest pain and palpitations.  Gastrointestinal: Negative.   Genitourinary: Negative.   Musculoskeletal: Positive for joint pain and myalgias. Negative for back pain, falls and neck pain.  Skin: Negative for itching and rash.  Neurological: Negative.  Negative for weakness.  Endo/Heme/Allergies: Negative for environmental allergies and polydipsia. Does not bruise/bleed easily.  Psychiatric/Behavioral: Negative.     Otherwise per HPI.  HISTORY & PERTINENT PRIOR DATA:  No specialty comments available. She reports that she has never smoked. She has never used smokeless tobacco. No results for  input(s): HGBA1C, LABURIC in the last 8760 hours. Medications & Allergies reviewed per EMR Patient Active Problem List   Diagnosis Date Noted  . Lisfranc's sprain, right, subsequent encounter 10/23/2016  . Gait abnormality 10/09/2016  . Hip flexor tendinitis 09/05/2016  . Degenerative arthritis of right knee 08/06/2016  . Lateral meniscus tear 08/06/2016   No past medical history on file. No family history on file. No past surgical history on file. Social History   Occupational History  . Not on file.   Social History Main Topics  . Smoking status: Never Smoker  . Smokeless tobacco: Never Used  . Alcohol use Not on file  . Drug use: Unknown  . Sexual activity: Not on file    OBJECTIVE:  VS:  HT:    WT:   BMI:     BP:   HR: bpm  TEMP: ( )  RESP:  EXAM: Findings:  WDWN, NAD, Non-toxic appearing Alert & appropriately interactive Not depressed or anxious appearing No increased work of breathing. Pupils are equal. EOM intact without nystagmus No clubbing or cyanosis of the extremities appreciated No significant rashes/lesions/ulcerations overlying the examined area. DP & PT pulses 2+/4.  No significant pretibial edema. Sensation intact to light touch in lower extremities.  Right Knee: Overall joint is well aligned, no significant deformity.  Small amount of osteoarthritic bossing. Generalized synovitis without significant effusion.   ROM: 0 to 120.   Extensor mechanism intact Generalized TTP across the anterior medial and lateral joint lines.Marland Kitchen   3-4 mm opening with valgus and varus testing bilaterally.  Stable to anterior and posterior drawer.  No reproducible clicking with McMurray's.  Slightly positive patellar grind.  Right foot: High rigid cavus foot with no focal TTP over the midfoot.  She has only a small amount of pain with dorsiflexion to 95.  No significant Achilles pain.      No results found. ASSESSMENT & PLAN:   Problem List Items Addressed  This Visit    Degenerative arthritis of right knee    This has been exacerbated by wearing a boot and postoperative shoe.  Injection performed today.  She should begin a slow return to tennis and avoid any significant twisting activities.++++++++++++++++++++++++++++++++++++++++++++  PROCEDURE NOTE - ULTRASOUND GUIDED ASPIRATION & INJECTION: Right Knee Images were obtained and interpreted by myself, Gaspar BiddingMichael Saba Gomm, DO  Images have been saved and stored to PACS system. Images obtained on: GE S7 Ultrasound machine  ULTRASOUND FINDINGS: Small supraphysiologic effusion with degenerative bulging of the medial meniscus with spurring and small prior avulsion of the MCL versus loose body in the medial gutter.  DESCRIPTION OF PROCEDURE:  The patient's clinical condition is marked by substantial pain and/or significant functional disability. Other conservative therapy has not provided relief, is contraindicated, or not appropriate. There is a reasonable likelihood that injection will significantly improve the patient's pain and/or functional impairment. After discussing the risks, benefits and expected outcomes of the injection and all questions were reviewed and answered, the patient wished to undergo the above named procedure. Verbal consent was obtained. The ultrasound was used to identify the target structure and adjacent neurovascular structures. The skin was then prepped in sterile fashion and the target structure was injected under direct visualization using sterile technique as below: PREP: Alcohol, Ethel Chloride APPROACH: Superiolateral, stopcock technique, 25g 1.5" needle INJECTATE: 2cc 1% lidocaine, 1 cc 40mg  DepoMedrol ASPIRATE: N/A DRESSING: Band-Aid   Post procedural instructions including recommending icing and warning signs for infection were reviewed. This procedure was well tolerated and there were no complications.   IMPRESSION: Succesful US Guided Aspiration & injection          Gait abnormality   Lisfranc's sprain, right, subsequent encounter    She still has mild pain with resisted foot abduction.  Small metatarsal cookie added to her shoes today.  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  We discussed appropriate cushioning for her tennis shoes and activity shoes as well as working on ankle range of motion.  We will plan to see her back in 4 weeks to ensure that she is return activities as tolerated and plan to assess her gait at that time with the IGLI's and her tennis shoes.  Could consider [employer] Orthotics for increased cushioning if persistent symptoms.       Other Visit Diagnoses    Chronic pain of right knee    -  Primary   Relevant Orders   US LIMITED JOINT SPACE STRUCTURES LOW RIGHT(NO LINKED CHARGES)      Follow-up: Return in about 4 weeks (around 01/07/2017).   CMA/ATC served as Neurosurgeonscribe during this visit. History, Physical, and Plan performed by medical provider. Documentation and orders reviewed and attested to.      Gaspar BiddingMichael Shamiyah Ngu, DO    [facility] Physician

## 2016-12-20 ENCOUNTER — Other Ambulatory Visit: Payer: Self-pay | Admitting: Sports Medicine

## 2016-12-20 DIAGNOSIS — M1711 Unilateral primary osteoarthritis, right knee: Secondary | ICD-10-CM

## 2017-01-07 ENCOUNTER — Encounter: Payer: Self-pay | Admitting: Sports Medicine

## 2017-01-07 ENCOUNTER — Ambulatory Visit (INDEPENDENT_AMBULATORY_CARE_PROVIDER_SITE_OTHER): Payer: Medicare Other | Admitting: Sports Medicine

## 2017-01-07 VITALS — BP 122/76 | HR 68 | Ht 62.0 in | Wt 176.8 lb

## 2017-01-07 DIAGNOSIS — S93621D Sprain of tarsometatarsal ligament of right foot, subsequent encounter: Secondary | ICD-10-CM

## 2017-01-07 DIAGNOSIS — M1711 Unilateral primary osteoarthritis, right knee: Secondary | ICD-10-CM

## 2017-01-07 DIAGNOSIS — R269 Unspecified abnormalities of gait and mobility: Secondary | ICD-10-CM

## 2017-01-07 NOTE — Assessment & Plan Note (Signed)
Overall this is significantly better.  We will have her slowly continue to increase her activity as tolerated.  Modification to orthotics was made today as below.  If any lack of improvement can consider further custom cushioned insoles versus surgical consultation.

## 2017-01-07 NOTE — Assessment & Plan Note (Signed)
Overall better after last injection.  Only mild intermittent pain.  We will have her try to wean off taking Celebrex on a daily basis never take it bursting as needed.  Continue with strengthening exercises and slow progression back to tennis

## 2017-01-07 NOTE — Assessment & Plan Note (Signed)
PROCEDURE: ORTHOTIC MANAGEMENT Patient's underlying musculoskeletal conditions are directly related to poor biomechanics and will benefit from a functional custom orthotic.  There are no significant foot deformities that complicate the use of a custom orthotic.  Patient has IGLI orthotics.  Approximately 12 minutes was spent in direct fabrication, modification, fitting, reevaluating and training the patient on appropriate use and care.  Patient noted the orthotics provided adequate comfort and there is no reported pressure points or areas of discomfort.  Additional long arch support as well as transverse arch support was molded into the carbon footplate.  Lateral postings were left intact.

## 2017-01-07 NOTE — Progress Notes (Signed)
OFFICE VISIT NOTE Mariah FellsMichael D. Delorise Shinerigby, DO  [place] [facility] [place] [facility] [phone]  Mariah BroodMary Rosario - 71 y.o. female MRN [id number]  Date of birth: 12-27-1945  Visit Date: 01/07/2017  PCP: Patient, No Pcp Per   Referred by: No ref. provider found  Mariah DakinBrandy Rosario, CMA acting as scribe for Mariah Rosario.  SUBJECTIVE:   Chief Complaint  Patient presents with  . Follow-up    osteoarthritis of the right knoee, bilateral foot pain, Lisfranc sprain (right), gait abnormality   HPI: As below and per problem based documentation when appropriate.  Pt presents today for 4 week follow-up of lisfranc fracture (right), bilateral foot pain, gait abnormality, and osteoarthritis of the right knee.   Pt had ultrasound done 12/10/16 which showed the following: ULTRASOUND FINDINGS: Small supraphysiologic effusion with degenerative bulging of the medial meniscus with spurring and small prior avulsion of the MCL versus loose body in the medial gutter. She received steroid injection into the right knee. She has been taking Celebrex BID, prn.   Pt reports occasional pain in the right knee. This past week has been pretty good but she is still taking the Celebrex but only once daily. She still notices some mild occasional swelling in the knee. Pain seems to be unpredictable but seems to be pretty consistent if going up or down stairs.   Pt stopped wearing her boot about 4 weeks ago. She has not had any pain in her right foot since then. No pain with walking or bending. She says that the pain in her left foot has resolved. She says that things have evened out and she has "redone" her whole shoe collection, now wearing more supportive shoes.   She has noticed that she is waking up at night with bilateral lower leg cramps. This seems to be more of an issue after doing more stretches. The pain is so intense that she has to get out of bed to alleviate it.   Pt. Denies fever, chills,  night sweats.     Review of Systems  Constitutional: Negative for chills and fever.  Respiratory: Negative for shortness of breath and wheezing.   Cardiovascular: Positive for leg swelling. Negative for chest pain and palpitations.  Musculoskeletal: Positive for joint pain. Negative for falls.  Neurological: Negative for dizziness, tingling and headaches.  Endo/Heme/Allergies: Bruises/bleeds easily.    Otherwise per HPI.  HISTORY & PERTINENT PRIOR DATA:  No specialty comments available. She reports that she has never smoked. She has never used smokeless tobacco. No results for input(s): HGBA1C, LABURIC in the last 8760 hours. Medications & Allergies reviewed per EMR Patient Active Problem List   Diagnosis Date Noted  . Lisfranc's sprain, right, subsequent encounter 10/23/2016  . Gait abnormality 10/09/2016  . Hip flexor tendinitis 09/05/2016  . Degenerative arthritis of right knee 08/06/2016  . Lateral meniscus tear 08/06/2016   No past medical history on file. No family history on file. No past surgical history on file. Social History   Occupational History  . Not on file.   Social History Main Topics  . Smoking status: Never Smoker  . Smokeless tobacco: Never Used  . Alcohol use Not on file  . Drug use: Unknown  . Sexual activity: Not on file    OBJECTIVE:  VS:  HT:5\' 2"  (157.5 cm)   WT:176 lb 12.8 oz (80.2 kg)  BMI:32.4    BP:122/76  HR:68bpm  TEMP: ( )  RESP:98 % EXAM: Findings:  Bilateral lower  extremities overall well aligned.  No significant deformity.  She has a moderately high cavus foot on the right greater than left but equally rigid.  She has only minimal pain with forefoot abduction and no pain with palpation of the midfoot.  DP PT pulses 2+/4.     No results found. ASSESSMENT & PLAN:   Problem List Items Addressed This Visit    Degenerative arthritis of right knee    Overall better after last injection.  Only mild intermittent pain.  We will have  her try to wean off taking Celebrex on a daily basis never take it bursting as needed.  Continue with strengthening exercises and slow progression back to tennis      Gait abnormality    PROCEDURE: ORTHOTIC MANAGEMENT Patient's underlying musculoskeletal conditions are directly related to poor biomechanics and will benefit from a functional custom orthotic.  There are no significant foot deformities that complicate the use of a custom orthotic.  Patient has IGLI orthotics.  Approximately 12 minutes was spent in direct fabrication, modification, fitting, reevaluating and training the patient on appropriate use and care.  Patient noted the orthotics provided adequate comfort and there is no reported pressure points or areas of discomfort.  Additional long arch support as well as transverse arch support was molded into the carbon footplate.  Lateral postings were left intact.      Lisfranc's sprain, right, subsequent encounter - Primary    Overall this is significantly better.  We will have her slowly continue to increase her activity as tolerated.  Modification to orthotics was made today as below.  If any lack of improvement can consider further custom cushioned insoles versus surgical consultation.         Follow-up: Return if symptoms worsen or fail to improve.   CMA/ATC served as Neurosurgeon during this visit. History, Physical, and Plan performed by medical provider. Documentation and orders reviewed and attested to.      Mariah Bidding, DO    Mariah Rosario Sports Medicine Physician

## 2017-01-23 DIAGNOSIS — D692 Other nonthrombocytopenic purpura: Secondary | ICD-10-CM | POA: Diagnosis not present

## 2017-01-23 DIAGNOSIS — D1801 Hemangioma of skin and subcutaneous tissue: Secondary | ICD-10-CM | POA: Diagnosis not present

## 2017-01-23 DIAGNOSIS — L814 Other melanin hyperpigmentation: Secondary | ICD-10-CM | POA: Diagnosis not present

## 2017-01-23 DIAGNOSIS — L821 Other seborrheic keratosis: Secondary | ICD-10-CM | POA: Diagnosis not present

## 2017-02-04 DIAGNOSIS — H2513 Age-related nuclear cataract, bilateral: Secondary | ICD-10-CM | POA: Diagnosis not present

## 2017-02-27 DIAGNOSIS — Z23 Encounter for immunization: Secondary | ICD-10-CM | POA: Diagnosis not present

## 2017-05-26 DIAGNOSIS — R82998 Other abnormal findings in urine: Secondary | ICD-10-CM | POA: Diagnosis not present

## 2017-05-28 DIAGNOSIS — E048 Other specified nontoxic goiter: Secondary | ICD-10-CM | POA: Diagnosis not present

## 2017-05-28 DIAGNOSIS — R7301 Impaired fasting glucose: Secondary | ICD-10-CM | POA: Diagnosis not present

## 2017-05-28 DIAGNOSIS — E7849 Other hyperlipidemia: Secondary | ICD-10-CM | POA: Diagnosis not present

## 2017-06-09 DIAGNOSIS — Z0000 Encounter for general adult medical examination without abnormal findings: Secondary | ICD-10-CM | POA: Diagnosis not present

## 2017-06-09 DIAGNOSIS — R7301 Impaired fasting glucose: Secondary | ICD-10-CM | POA: Diagnosis not present

## 2017-06-09 DIAGNOSIS — Z1212 Encounter for screening for malignant neoplasm of rectum: Secondary | ICD-10-CM | POA: Diagnosis not present

## 2017-06-09 DIAGNOSIS — L988 Other specified disorders of the skin and subcutaneous tissue: Secondary | ICD-10-CM | POA: Diagnosis not present

## 2017-06-09 DIAGNOSIS — M25561 Pain in right knee: Secondary | ICD-10-CM | POA: Diagnosis not present

## 2017-07-15 ENCOUNTER — Ambulatory Visit: Payer: Medicare Other | Admitting: Sports Medicine

## 2017-07-15 ENCOUNTER — Ambulatory Visit (INDEPENDENT_AMBULATORY_CARE_PROVIDER_SITE_OTHER): Payer: Medicare Other

## 2017-07-15 ENCOUNTER — Encounter: Payer: Self-pay | Admitting: Sports Medicine

## 2017-07-15 ENCOUNTER — Other Ambulatory Visit: Payer: Self-pay | Admitting: Sports Medicine

## 2017-07-15 VITALS — BP 110/74 | HR 61 | Ht 62.0 in | Wt 158.0 lb

## 2017-07-15 DIAGNOSIS — M25551 Pain in right hip: Secondary | ICD-10-CM

## 2017-07-15 DIAGNOSIS — M25552 Pain in left hip: Secondary | ICD-10-CM

## 2017-07-15 DIAGNOSIS — M47816 Spondylosis without myelopathy or radiculopathy, lumbar region: Secondary | ICD-10-CM | POA: Diagnosis not present

## 2017-07-15 MED ORDER — NITROGLYCERIN 0.2 MG/HR TD PT24: MEDICATED_PATCH | TRANSDERMAL | 1 refills | Status: AC

## 2017-07-15 NOTE — Procedures (Signed)
PROCEDURE NOTE: THERAPEUTIC EXERCISES (97110) 15 minutes spent for Therapeutic exercises as below and as referenced in the AVS. This included exercises focusing on stretching, strengthening, with significant focus on eccentric aspects.  Proper technique shown and discussed handout in great detail with ATC. All questions were discussed and answered.   Long term goals include an improvement in range of motion, strength, endurance as well as avoiding reinjury. Frequency of visits is one time as determined during today's  office visit. Frequency of exercises to be performed is as per handout.  EXERCISES REVIEWED:  Hip abduction strengthening  IT band stretching  Goodman exercises

## 2017-07-15 NOTE — Patient Instructions (Addendum)
Nitroglycerin Protocol   Apply 1/4 nitroglycerin patch to affected area daily.  Change position of patch within the affected area every 24 hours.  You may experience a headache during the first 1-2 weeks of using the patch, these should subside.  If you experience headaches after beginning nitroglycerin patch treatment, you may take your preferred over the counter pain reliever.  Another side effect of the nitroglycerin patch is skin irritation or rash related to patch adhesive.  Please notify our office if you develop more severe headaches or rash, and stop the patch.  Tendon healing with nitroglycerin patch may require 12 to 24 weeks depending on the extent of injury.  Men should not use if taking Viagra, Cialis, or Levitra.   Do not use if you have migraines or rosacea.    Portable times Please perform the exercise program that we have prepared for you and gone over in detail on a daily basis.  In addition to the handout you were provided you can access your program through: [email or web address]   Your unique program code is: [email or web address]   Also check out [employer]"Foundation Training" which is a program developed by Dr. Myles LippsEric Goodman.   There are links to a couple of his YouTube Videos below and I would like to see performing one of his videos 5-6 days per week.    A good intro video is: "Independence from Pain 7-minute Video" - [email or web address]   His more advanced video is: "Powerful Posture and Pain Relief: 12 minutes of Foundation Training" - https://youtu.be/[email or web address]  Do not try to attempt this entire video when first beginning.    Try breaking of each exercise that he goes into shorter segments.  Otherwise if they perform an exercise for 45 seconds, start with 15 seconds and rest and then resume when they begin the new activity.    If you work your way up to doing this 12 minute video, I expect you will see significant improvements in your pain.  If you  enjoy his videos and would like to find out more you can look on his website: [email or web address].  He has a workout streaming option as well as a DVD set available for purchase.  Amazon has the best price for his DVDs.

## 2017-07-15 NOTE — Progress Notes (Signed)
Mariah Rosario. Mariah Rosario Sports Medicine [facility] [phone]  Brit Carbonell - 72 y.o. female MRN [id number]  Date of birth: 1945-12-02  Visit Date: 07/15/2017  PCP: Patient, No Pcp Per   Referred by: No ref. provider found   Scribe for today's visit: Stevenson Clinch, CMA     SUBJECTIVE:  Mariah Rosario "Mariah Rosario" is here for bilateral hip pain  Her bilateral hip pain symptoms INITIALLY: Began 3 months ago but got worse over the past month and MOI is unknown. LT hip pain worse than RT. Described as mild dull aching, nonradiating. Worsened with lying on side.  Improved with activity.  Additional associated symptoms include: She denies groin pain, low back pain. She denies decreased ROM.     At this time symptoms are worsening compared to onset. She has been taking Tylenol and using Voltaren with some relief.    ROS Reports night time disturbances. Denies fevers, chills, or night sweats. Denies unexplained weight loss. Denies personal history of cancer. Denies changes in bowel or bladder habits. Denies recent unreported falls. Denies new or worsening dyspnea or wheezing. Denies headaches or dizziness.  Denies numbness, tingling or weakness  In the extremities.  Denies dizziness or presyncopal episodes Denies lower extremity edema     HISTORY & PERTINENT PRIOR DATA:  Prior History reviewed and updated per electronic medical record.  Significant history, findings, studies and interim changes include:  reports that  has never smoked. she has never used smokeless tobacco. No results for input(s): HGBA1C, LABURIC, CREATINE in the last 8760 hours. No specialty comments available. No problems updated.  OBJECTIVE:  VS:  HT:5\' 2"  (157.5 cm)   WT:158 lb (71.7 kg)  BMI:28.89    BP:110/74  HR:61bpm  TEMP: ( )  RESP:98 %   PHYSICAL EXAM: Constitutional: WDWN, Non-toxic appearing. Psychiatric: Alert & appropriately interactive.  Not  depressed or anxious appearing. Respiratory: No increased work of breathing.  Trachea Midline Eyes: Pupils are equal.  EOM intact without nystagmus.  No scleral icterus  NEUROVASCULAR exam: No clubbing or cyanosis appreciated No significant venous stasis changes Capillary Refill: normal, less than 2 seconds   LOWER EXTREMITIES Pre-tibial edema: No significant pretibial edema Pedal Pulses: Normal & symmetrically palpable Dermatomes intact to light touch Normal strength in all myotomes Reflexes: Normal & symmetric DTRs  Negative straight leg raise.  She has mild TTP over the gluteal tendons bilaterally left worse than right.   No additional findings.   ASSESSMENT & PLAN:   1. 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  Retrosternal protocol and therapeutic exercises reviewed per AVS.  Will plan for [facility] ultrasound at follow-up in 6 weeks.  No problem-specific Assessment & Plan notes found for this encounter.   ++++++++++++++++++++++++++++++++++++++++++++ Orders & Meds: Orders Placed This Encounter  Procedures  . Misc procedure  . DG Lumbar Spine Complete    Meds ordered this encounter  Medications  . nitroGLYCERIN (NITRODUR - DOSED IN MG/24 HR) 0.2 mg/hr patch    Sig: Place 1/4 to 1/2 of a patch over affected region. Remove and replace once daily.  Slightly alter skin placement daily    Dispense:  30 patch    Refill:  1    For musculoskeletal purposes.  Okay to cut patch.    ++++++++++++++++++++++++++++++++++++++++++++ Follow-up: Return in about 6 weeks (around 08/26/2017).   Pertinent documentation may be included in additional procedure notes,  imaging studies, problem based documentation and patient instructions. Please see these sections of the encounter for additional information regarding this visit. CMA/ATC served as Neurosurgeonscribe during this visit. History, Physical, and Plan performed by  medical provider. Documentation and orders reviewed and attested to.      Mariah MewsMichael Rosario Mariah Liguori, DO    [facility] Phys[facility]

## 2017-08-04 ENCOUNTER — Encounter: Payer: Self-pay | Admitting: Sports Medicine

## 2017-08-26 ENCOUNTER — Ambulatory Visit: Payer: Medicare Other | Admitting: Sports Medicine

## 2017-08-26 ENCOUNTER — Encounter: Payer: Self-pay | Admitting: Sports Medicine

## 2017-08-26 VITALS — BP 120/72 | HR 67 | Ht 62.0 in | Wt 151.4 lb

## 2017-08-26 DIAGNOSIS — M25552 Pain in left hip: Secondary | ICD-10-CM

## 2017-08-26 DIAGNOSIS — M25551 Pain in right hip: Secondary | ICD-10-CM | POA: Diagnosis not present

## 2017-08-26 DIAGNOSIS — R29898 Other symptoms and signs involving the musculoskeletal system: Secondary | ICD-10-CM | POA: Diagnosis not present

## 2017-08-26 DIAGNOSIS — R269 Unspecified abnormalities of gait and mobility: Secondary | ICD-10-CM | POA: Diagnosis not present

## 2017-08-26 NOTE — Progress Notes (Signed)
Mariah Rosario. Mariah Rosario Sports Medicine [facility] at [facility] [phone]  Mariah Rosario - 72 y.o. female MRN [id number]  Date of birth: 1945/10/21  Visit Date: 08/26/2017  PCP: Patient, No Pcp Per   Referred by: No ref. provider found   Scribe for today's visit: Stevenson Clinch, CMA     SUBJECTIVE:  Mariah Rosario "Efraim Kaufmann" is here for Follow-up (bilateral hip pain)  07/15/17: Her bilateral hip pain symptoms INITIALLY: Began 3 months ago but got worse over the past month and MOI is unknown. LT hip pain worse than RT. Described as mild dull aching, nonradiating. Worsened with lying on side.  Improved with activity.  Additional associated symptoms include: She denies groin pain, low back pain. She denies decreased ROM.  At this time symptoms are worsening compared to onset. She has been taking Tylenol and using Voltaren with some relief.   08/26/17: Compared to the last office visit, her previously described symptoms are improving, no longer waking up at night with pain.  Current symptoms are mild & are nonradiating. She does have some lower back pain.  She has been using Nitroglycerin patches with no side effects. She has been taking Tylenol and using topical Voltaren only prn. She has been doing HEP with no trouble. Pain is still trigger by playing tennis but she is working out only playing in moderation.    ROS Denies night time disturbances. Denies fevers, chills, or night sweats. Denies unexplained weight loss. Denies personal history of cancer. Denies changes in bowel or bladder habits. Denies recent unreported falls. Denies new or worsening dyspnea or wheezing. Denies headaches or dizziness.  Denies numbness, tingling or weakness  In the extremities.  Denies dizziness or presyncopal episodes Denies lower extremity edema    HISTORY & PERTINENT PRIOR DATA:  Prior History reviewed and updated per electronic medical record.  Significant  history, findings, studies and interim changes include:  reports that  has never smoked. she has never used smokeless tobacco. No results for input(s): HGBA1C, LABURIC, CREATINE in the last 8760 hours. No specialty comments available. No problems updated.  OBJECTIVE:  VS:  HT:5\' 2"  (157.5 cm)   WT:151 lb 6.4 oz (68.7 kg)  BMI:27.68    BP:120/72  HR:67bpm  TEMP: ( )  RESP:98 %   PHYSICAL EXAM: Constitutional: WDWN, Non-toxic appearing. Psychiatric: Alert & appropriately interactive.  Not depressed or anxious appearing. Respiratory: No increased work of breathing.  Trachea Midline Eyes: Pupils are equal.  EOM intact without nystagmus.  No scleral icterus  NEUROVASCULAR exam: No clubbing or cyanosis appreciated No significant venous stasis changes Capillary Refill: normal, less than 2 seconds   Bilateral legs overall well aligned strength with knee flexion hip flexion, knee extension strength is all 5+/5.  She has good internal and external rotation of bilateral hips.  She has no focal tenderness over the greater trochanters or glute medius musculature.  Hip abduction strength is 5/5 on the right with 4+/5 on the left with a T FL predominant recruitment pattern is still on the left with glute medius recruitment pattern on the right.   ASSESSMENT & PLAN:   1. Bilateral hip pain   2. Gait abnormality   3. Weakness of left hip     PLAN:   Findings:  She is doing quite well and has had significant improvements in her hip strength as well as almost complete resolution of her pain.  She has no further nighttime awakenings.  She has moderated  how much tennis she is playing as she does notice if she plays too often or for too long that it does flareup her symptoms.  We will have her continue with nitroglycerin for an additional 6 weeks until she is out of his current prescription and have her discontinue this at that time.  If she has any persistent symptoms or return of symptoms she will  plan to follow-up otherwise we will see her back only as needed.   Follow-up: Return if symptoms worsen or fail to improve.   Pertinent documentation may be included in additional procedure notes, imaging studies, problem based documentation and patient instructions. Please see these sections of the encounter for additional information regarding this visit. CMA/ATC served as Neurosurgeonscribe during this visit. History, Physical, and Plan performed by medical provider. Documentation and orders reviewed and attested to.      Andrena MewsMichael D Kadeshia Kasparian, DO    [facility] Physician

## 2017-10-15 DIAGNOSIS — Z1231 Encounter for screening mammogram for malignant neoplasm of breast: Secondary | ICD-10-CM | POA: Diagnosis not present

## 2017-10-15 DIAGNOSIS — Z01419 Encounter for gynecological examination (general) (routine) without abnormal findings: Secondary | ICD-10-CM | POA: Diagnosis not present

## 2017-10-15 DIAGNOSIS — Z6827 Body mass index (BMI) 27.0-27.9, adult: Secondary | ICD-10-CM | POA: Diagnosis not present

## 2017-10-15 DIAGNOSIS — Z124 Encounter for screening for malignant neoplasm of cervix: Secondary | ICD-10-CM | POA: Diagnosis not present

## 2017-10-15 DIAGNOSIS — N958 Other specified menopausal and perimenopausal disorders: Secondary | ICD-10-CM | POA: Diagnosis not present

## 2017-12-17 DIAGNOSIS — Z1159 Encounter for screening for other viral diseases: Secondary | ICD-10-CM | POA: Diagnosis not present

## 2018-01-29 DIAGNOSIS — L820 Inflamed seborrheic keratosis: Secondary | ICD-10-CM | POA: Diagnosis not present

## 2018-01-29 DIAGNOSIS — D692 Other nonthrombocytopenic purpura: Secondary | ICD-10-CM | POA: Diagnosis not present

## 2018-01-29 DIAGNOSIS — L814 Other melanin hyperpigmentation: Secondary | ICD-10-CM | POA: Diagnosis not present

## 2018-01-29 DIAGNOSIS — L821 Other seborrheic keratosis: Secondary | ICD-10-CM | POA: Diagnosis not present

## 2018-02-26 DIAGNOSIS — Z0000 Encounter for general adult medical examination without abnormal findings: Secondary | ICD-10-CM | POA: Diagnosis not present

## 2018-03-13 DIAGNOSIS — Z23 Encounter for immunization: Secondary | ICD-10-CM | POA: Diagnosis not present

## 2018-05-06 DIAGNOSIS — H35363 Drusen (degenerative) of macula, bilateral: Secondary | ICD-10-CM | POA: Diagnosis not present

## 2018-05-06 DIAGNOSIS — H52223 Regular astigmatism, bilateral: Secondary | ICD-10-CM | POA: Diagnosis not present

## 2018-05-06 DIAGNOSIS — H2513 Age-related nuclear cataract, bilateral: Secondary | ICD-10-CM | POA: Diagnosis not present

## 2018-05-06 DIAGNOSIS — H5203 Hypermetropia, bilateral: Secondary | ICD-10-CM | POA: Diagnosis not present

## 2018-06-05 DIAGNOSIS — R7301 Impaired fasting glucose: Secondary | ICD-10-CM | POA: Diagnosis not present

## 2018-06-05 DIAGNOSIS — R82998 Other abnormal findings in urine: Secondary | ICD-10-CM | POA: Diagnosis not present

## 2018-06-05 DIAGNOSIS — E7849 Other hyperlipidemia: Secondary | ICD-10-CM | POA: Diagnosis not present

## 2018-06-05 DIAGNOSIS — E048 Other specified nontoxic goiter: Secondary | ICD-10-CM | POA: Diagnosis not present

## 2018-06-11 DIAGNOSIS — L988 Other specified disorders of the skin and subcutaneous tissue: Secondary | ICD-10-CM | POA: Diagnosis not present

## 2018-06-11 DIAGNOSIS — Z0000 Encounter for general adult medical examination without abnormal findings: Secondary | ICD-10-CM | POA: Diagnosis not present

## 2018-06-11 DIAGNOSIS — E048 Other specified nontoxic goiter: Secondary | ICD-10-CM | POA: Diagnosis not present

## 2018-06-11 DIAGNOSIS — R7301 Impaired fasting glucose: Secondary | ICD-10-CM | POA: Diagnosis not present

## 2018-06-12 DIAGNOSIS — Z1212 Encounter for screening for malignant neoplasm of rectum: Secondary | ICD-10-CM | POA: Diagnosis not present

## 2018-08-03 DIAGNOSIS — Z1211 Encounter for screening for malignant neoplasm of colon: Secondary | ICD-10-CM | POA: Diagnosis not present

## 2018-08-03 DIAGNOSIS — K5730 Diverticulosis of large intestine without perforation or abscess without bleeding: Secondary | ICD-10-CM | POA: Diagnosis not present

## 2018-08-31 DIAGNOSIS — H02826 Cysts of left eye, unspecified eyelid: Secondary | ICD-10-CM | POA: Diagnosis not present

## 2018-08-31 DIAGNOSIS — Z6827 Body mass index (BMI) 27.0-27.9, adult: Secondary | ICD-10-CM | POA: Diagnosis not present

## 2018-08-31 DIAGNOSIS — G4709 Other insomnia: Secondary | ICD-10-CM | POA: Diagnosis not present

## 2018-10-30 IMAGING — DX DG FOOT COMPLETE 3+V*R*
3 series · 3 of 3 positions shown · non-contrast
Comparison: None.

CLINICAL DATA: Sharp pain in RIGHT foot.

EXAM:
RIGHT FOOT COMPLETE - 3+ VIEW

[foot dp]
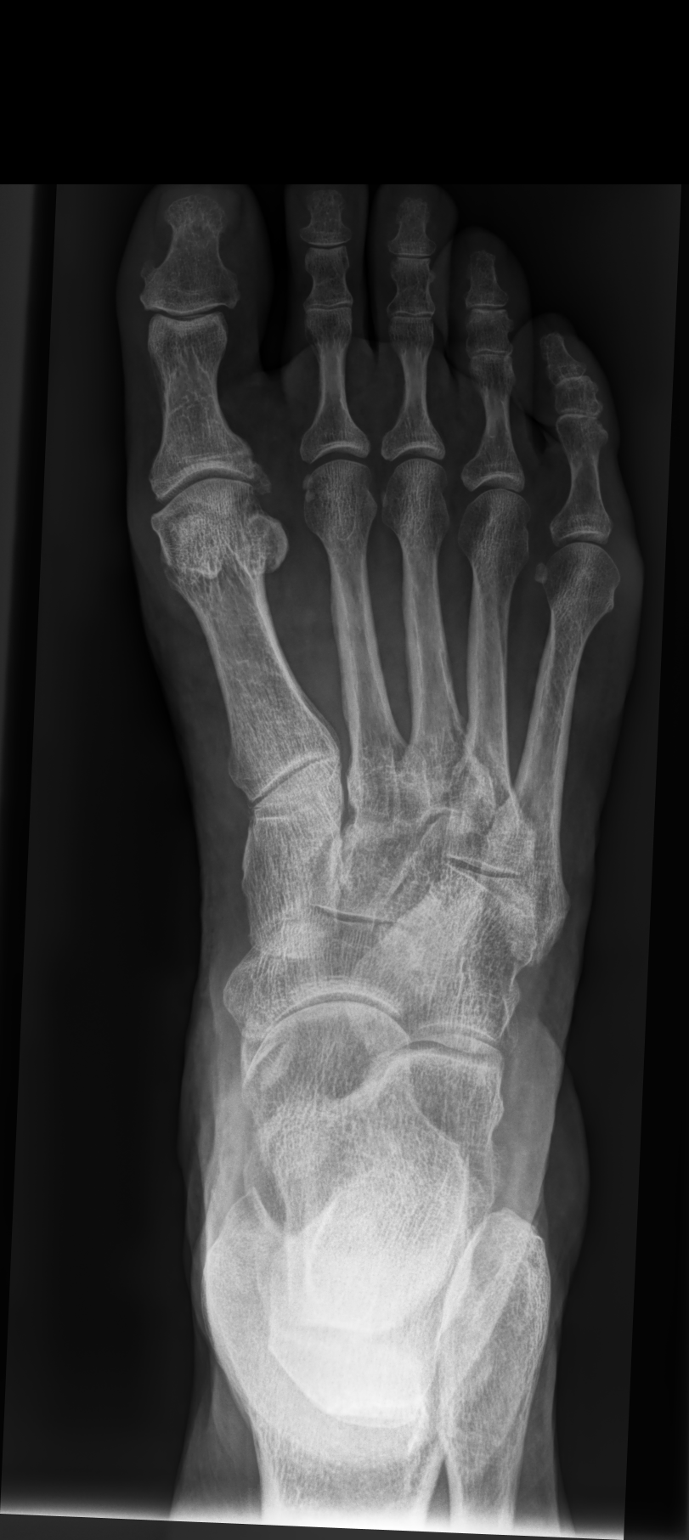

[foot lat]
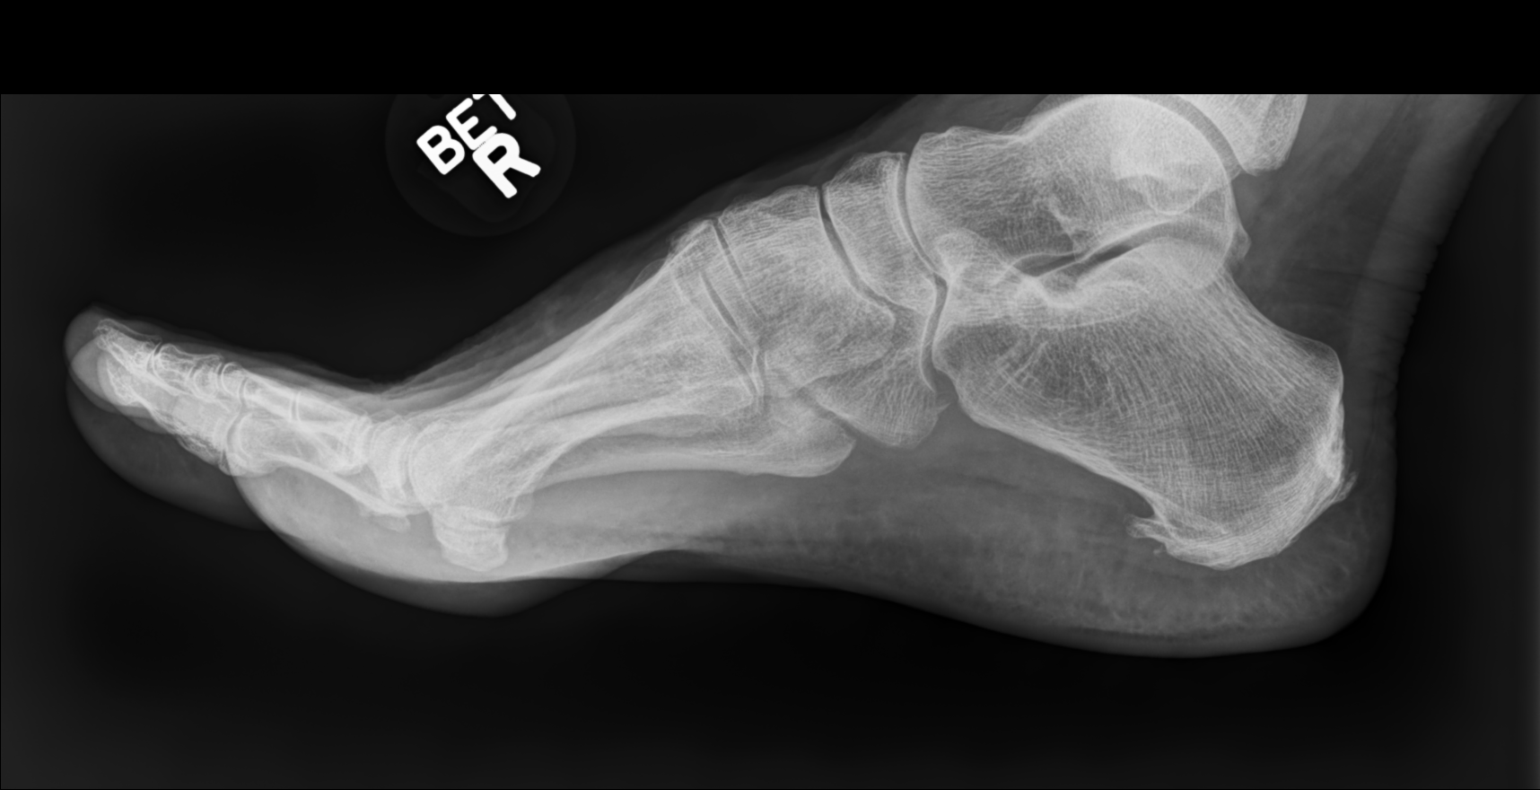

[foot oblique]
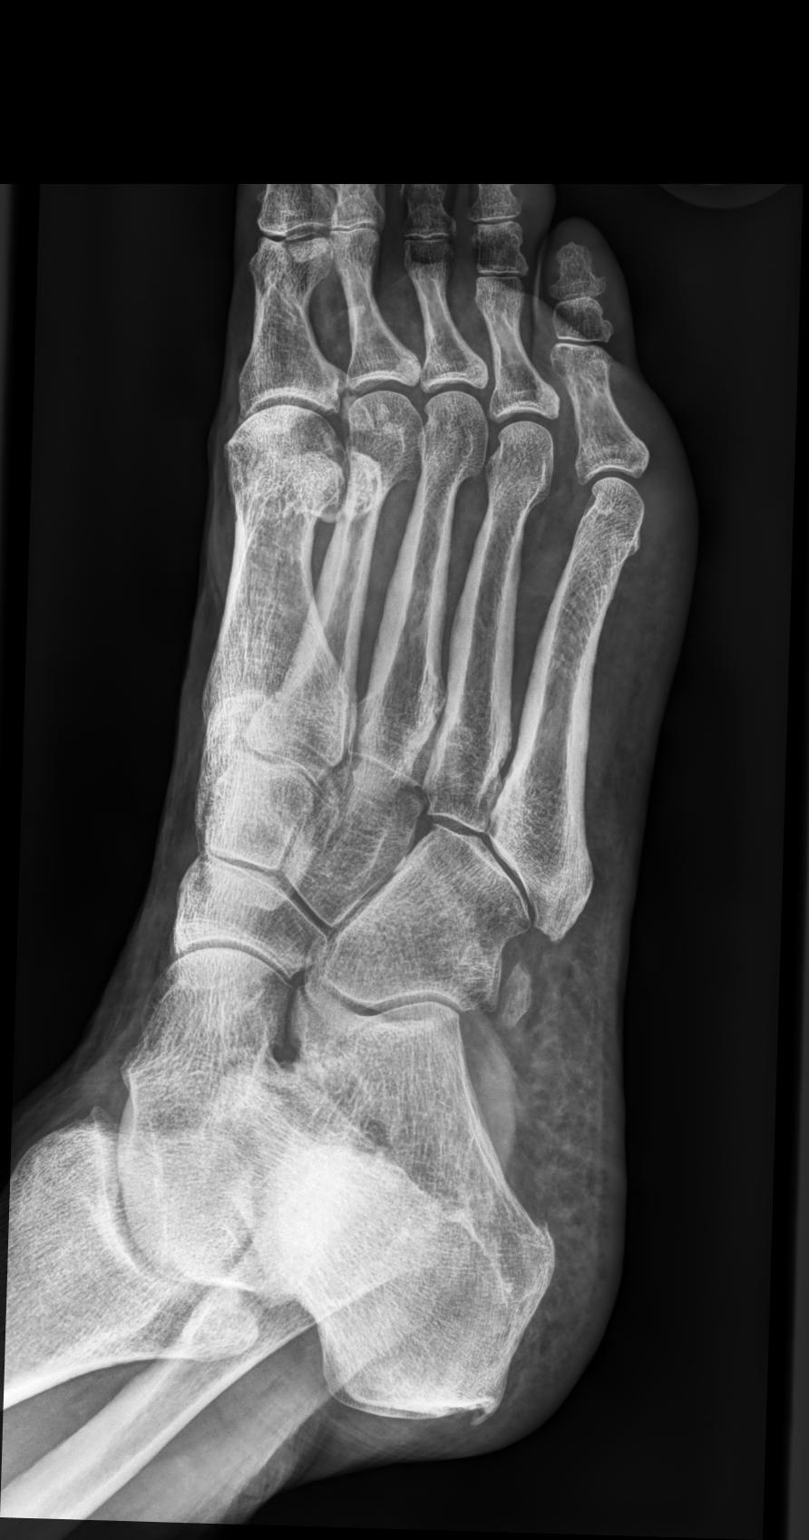

[3 of 3 positions shown; findings below may reference images not displayed]

FINDINGS: No fracture or dislocation of mid foot or forefoot. The phalanges
are normal. The calcaneus is normal. No soft tissue abnormality.
Enthesopathic spurring along the plantar aspect of the calcaneus.
IMPRESSION: No fracture or dislocation.

## 2018-11-03 DIAGNOSIS — Z01419 Encounter for gynecological examination (general) (routine) without abnormal findings: Secondary | ICD-10-CM | POA: Diagnosis not present

## 2018-11-03 DIAGNOSIS — Z1231 Encounter for screening mammogram for malignant neoplasm of breast: Secondary | ICD-10-CM | POA: Diagnosis not present

## 2018-11-03 DIAGNOSIS — Z6827 Body mass index (BMI) 27.0-27.9, adult: Secondary | ICD-10-CM | POA: Diagnosis not present

## 2019-02-04 DIAGNOSIS — D1801 Hemangioma of skin and subcutaneous tissue: Secondary | ICD-10-CM | POA: Diagnosis not present

## 2019-02-04 DIAGNOSIS — D692 Other nonthrombocytopenic purpura: Secondary | ICD-10-CM | POA: Diagnosis not present

## 2019-02-04 DIAGNOSIS — D225 Melanocytic nevi of trunk: Secondary | ICD-10-CM | POA: Diagnosis not present

## 2019-02-04 DIAGNOSIS — L570 Actinic keratosis: Secondary | ICD-10-CM | POA: Diagnosis not present

## 2019-03-25 DIAGNOSIS — Z23 Encounter for immunization: Secondary | ICD-10-CM | POA: Diagnosis not present

## 2019-04-30 DIAGNOSIS — H524 Presbyopia: Secondary | ICD-10-CM | POA: Diagnosis not present

## 2019-05-12 ENCOUNTER — Other Ambulatory Visit: Payer: Self-pay

## 2019-05-12 DIAGNOSIS — Z20822 Contact with and (suspected) exposure to covid-19: Secondary | ICD-10-CM

## 2019-05-14 LAB — NOVEL CORONAVIRUS, NAA: SARS-CoV-2, NAA: NOT DETECTED

## 2019-06-08 DIAGNOSIS — E7849 Other hyperlipidemia: Secondary | ICD-10-CM | POA: Diagnosis not present

## 2019-06-08 DIAGNOSIS — R7301 Impaired fasting glucose: Secondary | ICD-10-CM | POA: Diagnosis not present

## 2019-06-08 DIAGNOSIS — E049 Nontoxic goiter, unspecified: Secondary | ICD-10-CM | POA: Diagnosis not present

## 2019-06-14 DIAGNOSIS — R7301 Impaired fasting glucose: Secondary | ICD-10-CM | POA: Diagnosis not present

## 2019-06-14 DIAGNOSIS — R82998 Other abnormal findings in urine: Secondary | ICD-10-CM | POA: Diagnosis not present

## 2019-06-14 DIAGNOSIS — Z0000 Encounter for general adult medical examination without abnormal findings: Secondary | ICD-10-CM | POA: Diagnosis not present

## 2019-06-14 DIAGNOSIS — M25561 Pain in right knee: Secondary | ICD-10-CM | POA: Diagnosis not present

## 2019-06-14 DIAGNOSIS — M545 Low back pain: Secondary | ICD-10-CM | POA: Diagnosis not present

## 2019-06-16 DIAGNOSIS — Z1212 Encounter for screening for malignant neoplasm of rectum: Secondary | ICD-10-CM | POA: Diagnosis not present

## 2019-07-16 ENCOUNTER — Ambulatory Visit: Payer: Medicare Other | Attending: Internal Medicine

## 2019-07-16 DIAGNOSIS — Z23 Encounter for immunization: Secondary | ICD-10-CM | POA: Insufficient documentation

## 2019-07-16 NOTE — Progress Notes (Signed)
   Covid-19 Vaccination Clinic  Name:  Kaiesha Tonner    MRN: [id number] DOB: 12-Nov-1945  07/16/2019  Ms. Shiroma was observed post Covid-19 immunization for 15 minutes without incidence. She was provided with Vaccine Information Sheet and instruction to access the V-Safe system.   Ms. Sakurai was instructed to call 911 with any severe reactions post vaccine: Marland Kitchen Difficulty breathing  . Swelling of your face and throat  . A fast heartbeat  . A bad rash all over your body  . Dizziness and weakness    Immunizations Administered    Name Date Dose VIS Date Route   Pfizer COVID-19 Vaccine 07/16/2019  6:45 PM 0.3 mL 06/04/2019 Intramuscular   Manufacturer: [employer], [employer]   Lot: [id number]   NDC: [address]-2

## 2019-07-22 IMAGING — DX DG LUMBAR SPINE COMPLETE 4+V
4 series · 4 of 4 positions shown · non-contrast
Comparison: None.

CLINICAL DATA: Low back and bilateral hip pain without known
injury.

EXAM:
LUMBAR SPINE - COMPLETE 4+ VIEW

[lumbar spine ap]
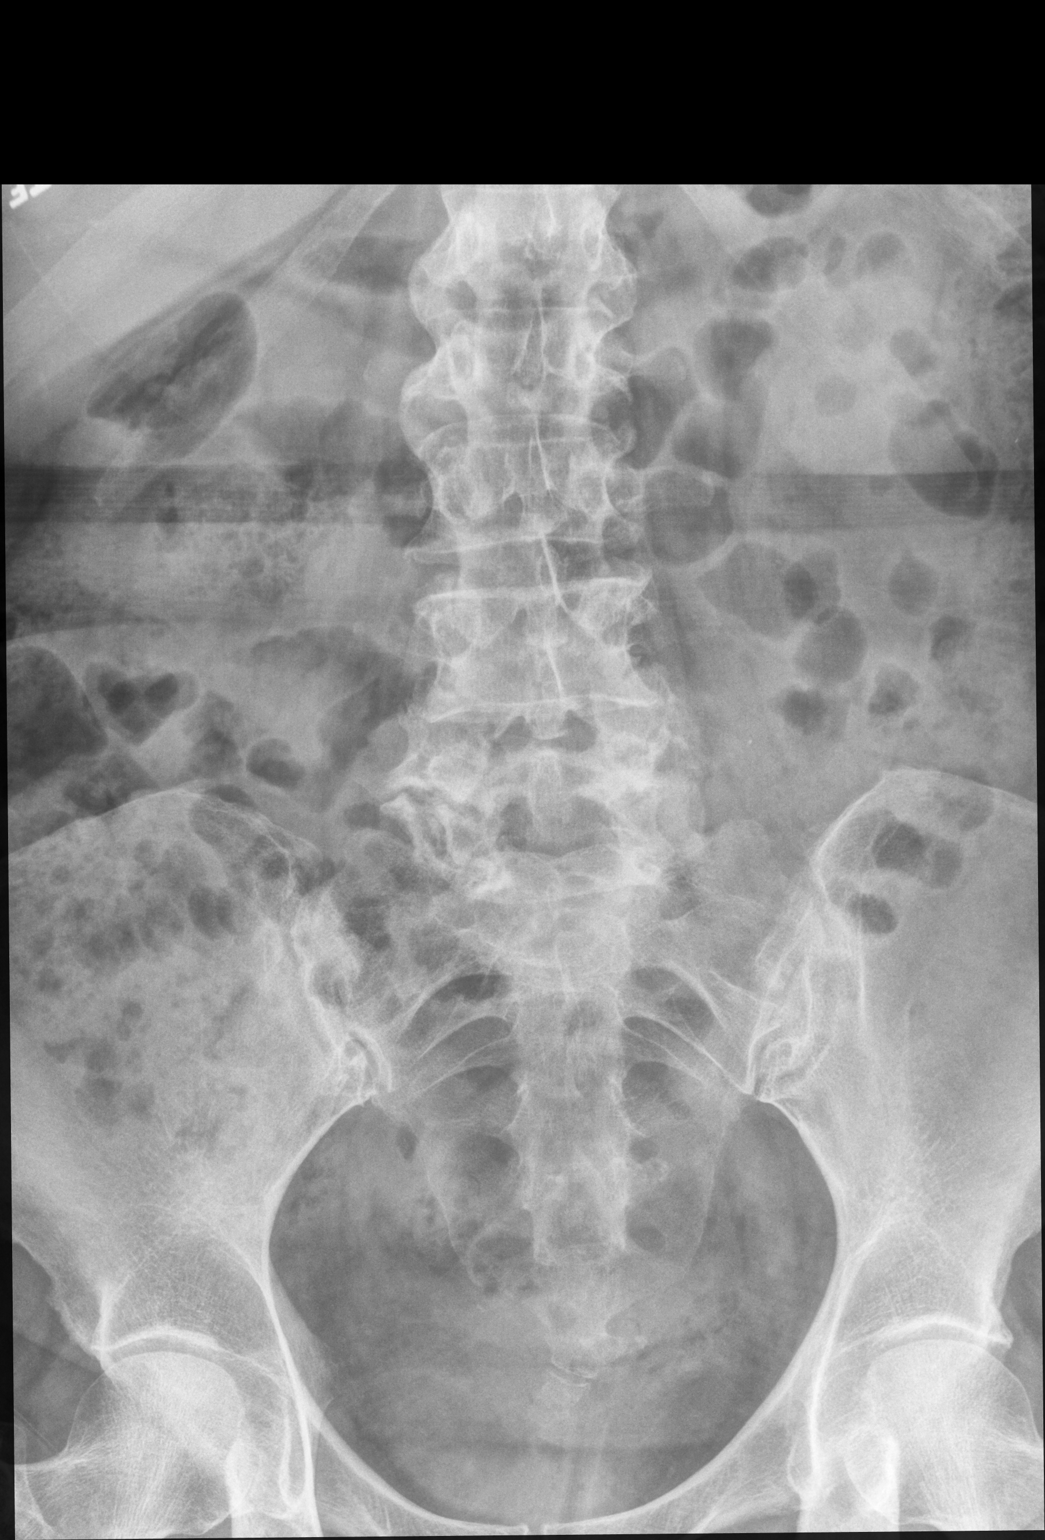

[lumbar spine oblique (1 of 2)]
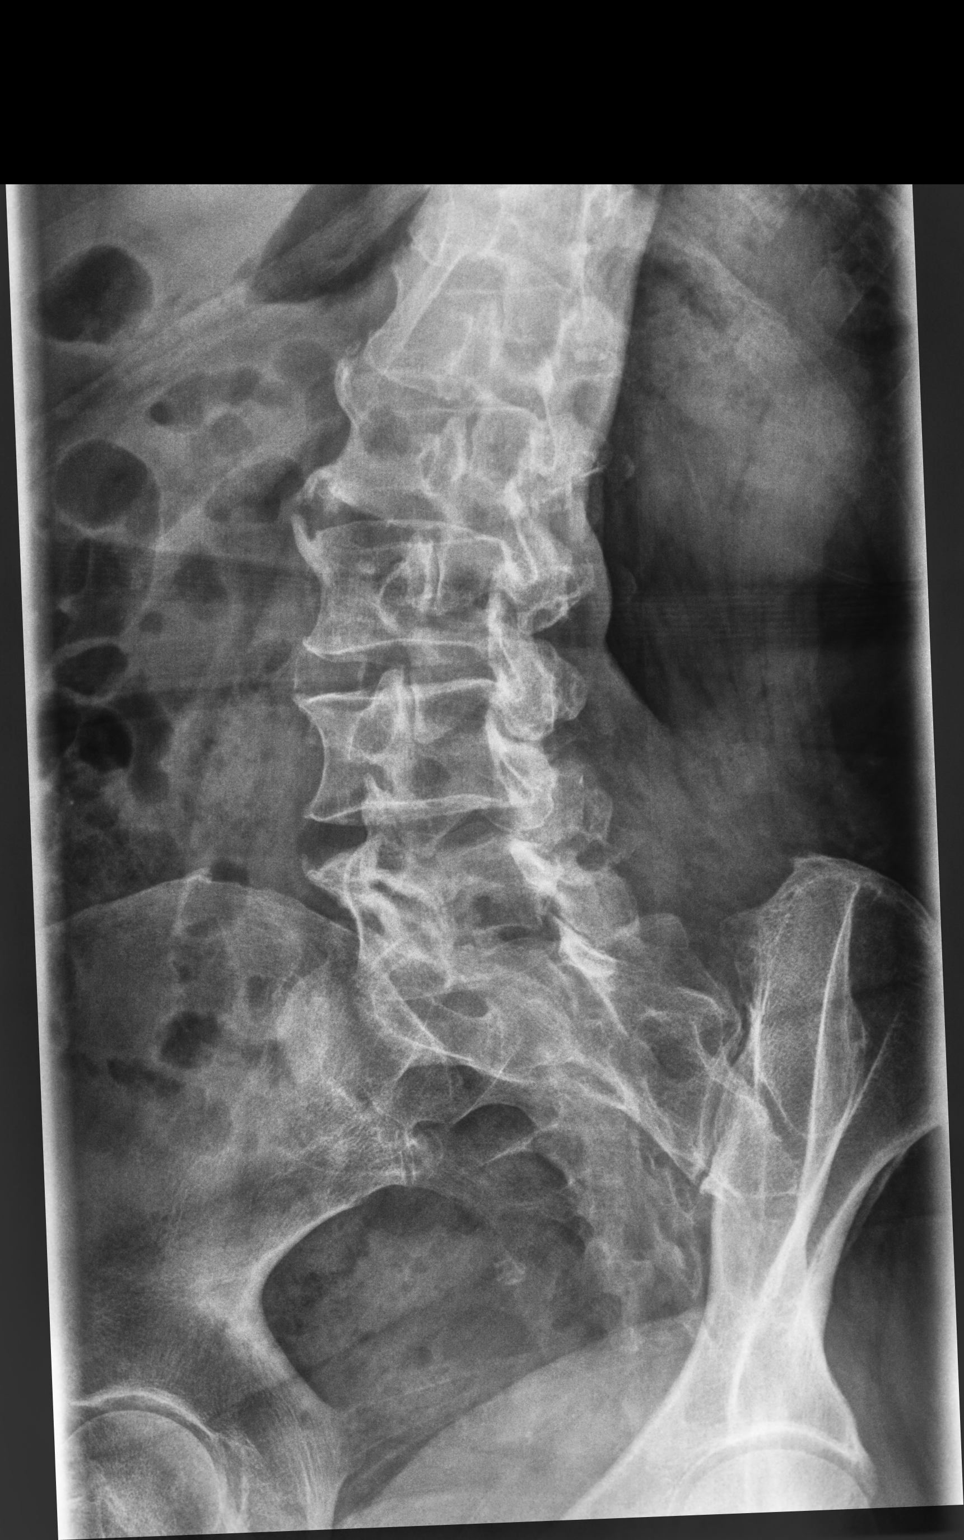

[lumbar spine oblique (2 of 2)]
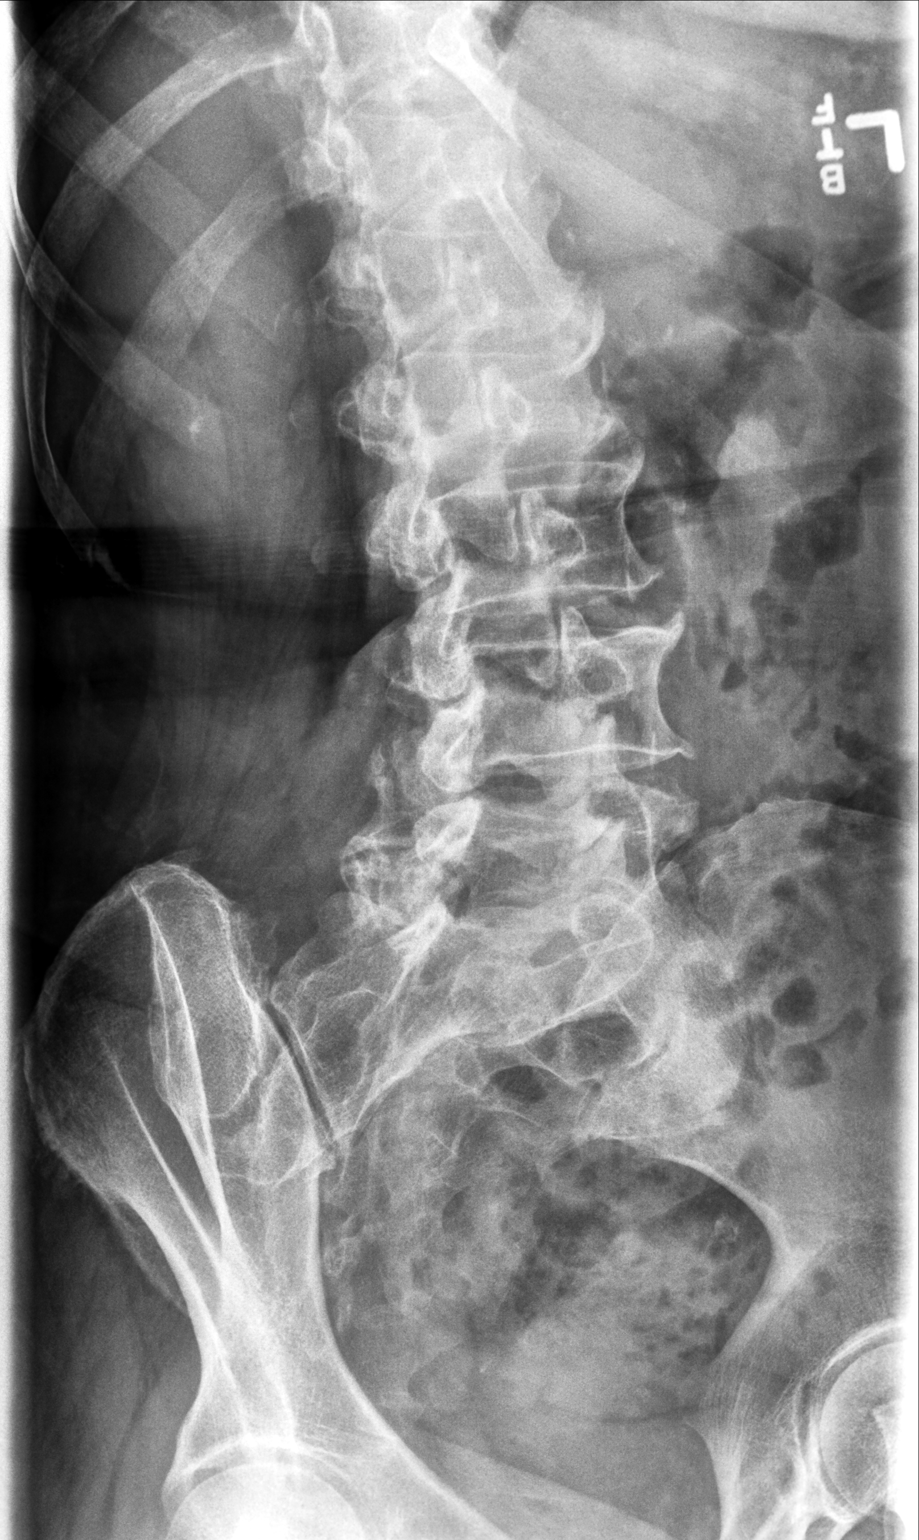

[lumbar spine lat]
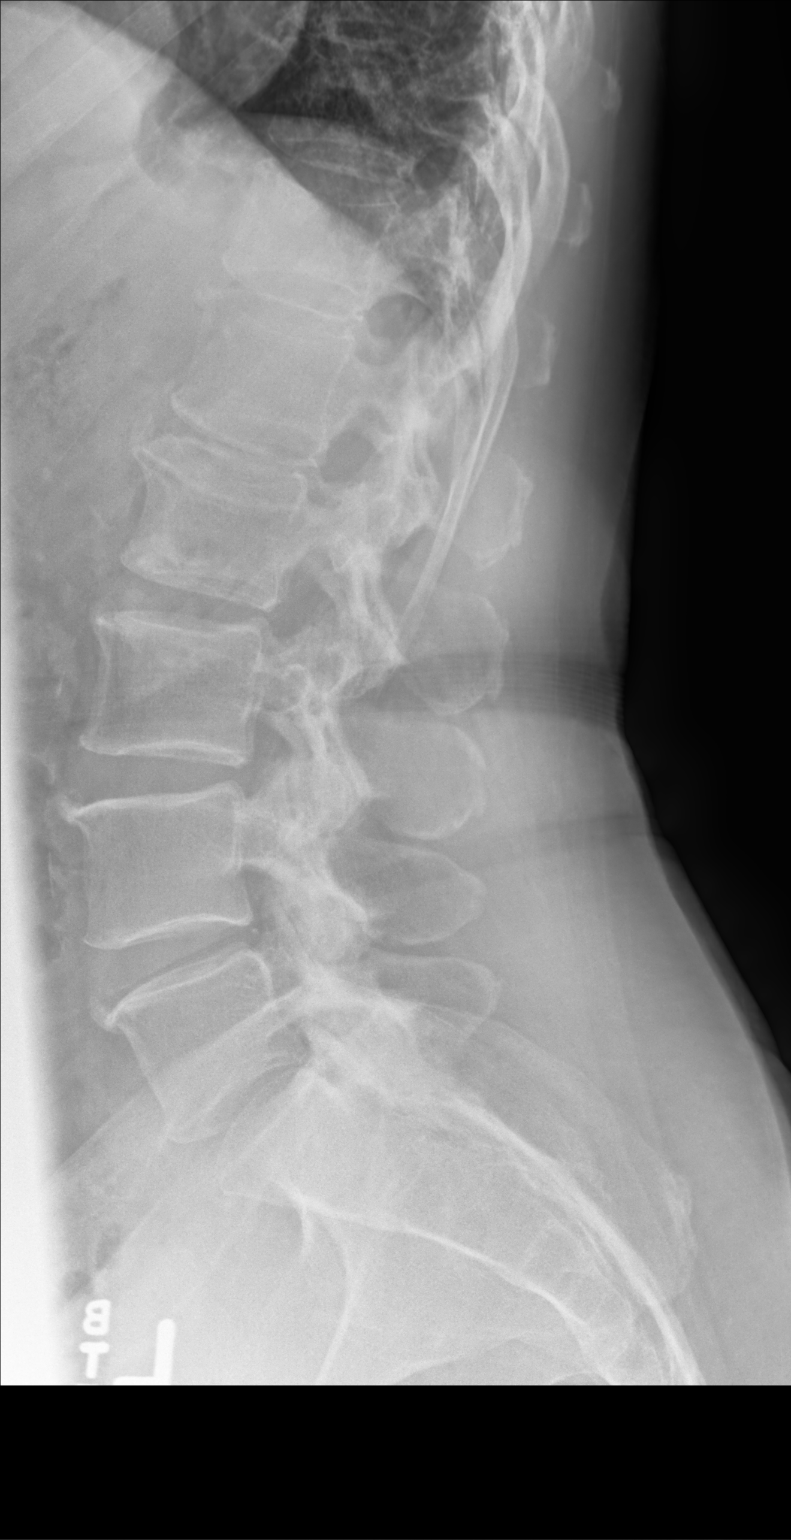

[4 of 4 positions shown; findings below may reference images not displayed]

FINDINGS: No evidence for an acute fracture no subluxation. Loss of
intervertebral disc height is seen at L1-2 and L5-S1. Bilateral
facet osteoarthritis is noted in the lower lumbar levels. SI joints
are unremarkable.
IMPRESSION: Degenerative changes without acute bony abnormality.

## 2019-08-06 ENCOUNTER — Ambulatory Visit: Payer: Medicare Other | Attending: Internal Medicine

## 2019-08-06 DIAGNOSIS — Z23 Encounter for immunization: Secondary | ICD-10-CM | POA: Insufficient documentation

## 2019-08-06 NOTE — Progress Notes (Signed)
   Covid-19 Vaccination Clinic  Name:  Mariah Rosario    MRN: [id number] DOB: 1945/09/15  08/06/2019  [name] was observed post Covid-19 immunization for 15 minutes without incidence. She was provided with Vaccine Information Sheet and instruction to access the V-Safe system.   [name] was instructed to call 911 with any severe reactions post vaccine: Marland Kitchen Difficulty breathing  . Swelling of your face and throat  . A fast heartbeat  . A bad rash all over your body  . Dizziness and weakness    Immunizations Administered    Name Date Dose VIS Date Route   Pfizer COVID-19 Vaccine 08/06/2019  8:29 AM 0.3 mL 06/04/2019 Intramuscular   Manufacturer: [employer], [employer]   Lot: [id number]   NDC: [address]-0

## 2019-08-15 ENCOUNTER — Ambulatory Visit: Payer: Medicare Other

## 2019-08-24 DIAGNOSIS — H40053 Ocular hypertension, bilateral: Secondary | ICD-10-CM | POA: Diagnosis not present

## 2019-08-24 DIAGNOSIS — H40013 Open angle with borderline findings, low risk, bilateral: Secondary | ICD-10-CM | POA: Diagnosis not present

## 2019-12-13 DIAGNOSIS — G4700 Insomnia, unspecified: Secondary | ICD-10-CM | POA: Diagnosis not present

## 2019-12-13 DIAGNOSIS — R7301 Impaired fasting glucose: Secondary | ICD-10-CM | POA: Diagnosis not present

## 2019-12-13 DIAGNOSIS — R030 Elevated blood-pressure reading, without diagnosis of hypertension: Secondary | ICD-10-CM | POA: Diagnosis not present

## 2019-12-13 DIAGNOSIS — M7711 Lateral epicondylitis, right elbow: Secondary | ICD-10-CM | POA: Diagnosis not present

## 2019-12-14 DIAGNOSIS — Z1231 Encounter for screening mammogram for malignant neoplasm of breast: Secondary | ICD-10-CM | POA: Diagnosis not present

## 2019-12-14 DIAGNOSIS — Z6829 Body mass index (BMI) 29.0-29.9, adult: Secondary | ICD-10-CM | POA: Diagnosis not present

## 2019-12-14 DIAGNOSIS — Z01419 Encounter for gynecological examination (general) (routine) without abnormal findings: Secondary | ICD-10-CM | POA: Diagnosis not present

## 2019-12-14 DIAGNOSIS — N958 Other specified menopausal and perimenopausal disorders: Secondary | ICD-10-CM | POA: Diagnosis not present

## 2020-02-10 DIAGNOSIS — D1801 Hemangioma of skin and subcutaneous tissue: Secondary | ICD-10-CM | POA: Diagnosis not present

## 2020-02-10 DIAGNOSIS — L814 Other melanin hyperpigmentation: Secondary | ICD-10-CM | POA: Diagnosis not present

## 2020-02-10 DIAGNOSIS — D225 Melanocytic nevi of trunk: Secondary | ICD-10-CM | POA: Diagnosis not present

## 2020-05-01 DIAGNOSIS — H524 Presbyopia: Secondary | ICD-10-CM | POA: Diagnosis not present

## 2020-06-15 DIAGNOSIS — R7301 Impaired fasting glucose: Secondary | ICD-10-CM | POA: Diagnosis not present

## 2020-06-15 DIAGNOSIS — E049 Nontoxic goiter, unspecified: Secondary | ICD-10-CM | POA: Diagnosis not present

## 2020-06-15 DIAGNOSIS — E785 Hyperlipidemia, unspecified: Secondary | ICD-10-CM | POA: Diagnosis not present

## 2020-07-07 DIAGNOSIS — R82998 Other abnormal findings in urine: Secondary | ICD-10-CM | POA: Diagnosis not present

## 2020-07-12 DIAGNOSIS — Z1212 Encounter for screening for malignant neoplasm of rectum: Secondary | ICD-10-CM | POA: Diagnosis not present

## 2020-08-02 DIAGNOSIS — H524 Presbyopia: Secondary | ICD-10-CM | POA: Diagnosis not present

## 2020-08-02 DIAGNOSIS — H52223 Regular astigmatism, bilateral: Secondary | ICD-10-CM | POA: Diagnosis not present

## 2020-08-02 DIAGNOSIS — H40013 Open angle with borderline findings, low risk, bilateral: Secondary | ICD-10-CM | POA: Diagnosis not present

## 2020-08-02 DIAGNOSIS — H5203 Hypermetropia, bilateral: Secondary | ICD-10-CM | POA: Diagnosis not present

## 2020-11-29 DIAGNOSIS — H52223 Regular astigmatism, bilateral: Secondary | ICD-10-CM | POA: Diagnosis not present

## 2020-11-29 DIAGNOSIS — H5203 Hypermetropia, bilateral: Secondary | ICD-10-CM | POA: Diagnosis not present

## 2020-11-29 DIAGNOSIS — H40013 Open angle with borderline findings, low risk, bilateral: Secondary | ICD-10-CM | POA: Diagnosis not present

## 2020-11-29 DIAGNOSIS — H40053 Ocular hypertension, bilateral: Secondary | ICD-10-CM | POA: Diagnosis not present

## 2020-12-21 DIAGNOSIS — Z6829 Body mass index (BMI) 29.0-29.9, adult: Secondary | ICD-10-CM | POA: Diagnosis not present

## 2020-12-21 DIAGNOSIS — Z1231 Encounter for screening mammogram for malignant neoplasm of breast: Secondary | ICD-10-CM | POA: Diagnosis not present

## 2020-12-21 DIAGNOSIS — Z124 Encounter for screening for malignant neoplasm of cervix: Secondary | ICD-10-CM | POA: Diagnosis not present

## 2020-12-21 DIAGNOSIS — Z01419 Encounter for gynecological examination (general) (routine) without abnormal findings: Secondary | ICD-10-CM | POA: Diagnosis not present

## 2021-01-12 DIAGNOSIS — R140 Abdominal distension (gaseous): Secondary | ICD-10-CM | POA: Diagnosis not present

## 2021-01-12 DIAGNOSIS — R0789 Other chest pain: Secondary | ICD-10-CM | POA: Diagnosis not present

## 2021-01-12 DIAGNOSIS — J45998 Other asthma: Secondary | ICD-10-CM | POA: Diagnosis not present

## 2021-02-15 DIAGNOSIS — D692 Other nonthrombocytopenic purpura: Secondary | ICD-10-CM | POA: Diagnosis not present

## 2021-02-15 DIAGNOSIS — L821 Other seborrheic keratosis: Secondary | ICD-10-CM | POA: Diagnosis not present

## 2021-02-15 DIAGNOSIS — L814 Other melanin hyperpigmentation: Secondary | ICD-10-CM | POA: Diagnosis not present

## 2021-02-15 DIAGNOSIS — D225 Melanocytic nevi of trunk: Secondary | ICD-10-CM | POA: Diagnosis not present

## 2021-05-28 DIAGNOSIS — M9907 Segmental and somatic dysfunction of upper extremity: Secondary | ICD-10-CM | POA: Diagnosis not present

## 2021-05-28 DIAGNOSIS — M25511 Pain in right shoulder: Secondary | ICD-10-CM | POA: Diagnosis not present

## 2021-05-28 DIAGNOSIS — M25311 Other instability, right shoulder: Secondary | ICD-10-CM | POA: Diagnosis not present

## 2021-05-28 DIAGNOSIS — M542 Cervicalgia: Secondary | ICD-10-CM | POA: Diagnosis not present

## 2021-06-27 DIAGNOSIS — M542 Cervicalgia: Secondary | ICD-10-CM | POA: Diagnosis not present

## 2021-06-27 DIAGNOSIS — M9901 Segmental and somatic dysfunction of cervical region: Secondary | ICD-10-CM | POA: Diagnosis not present

## 2021-06-27 DIAGNOSIS — M25511 Pain in right shoulder: Secondary | ICD-10-CM | POA: Diagnosis not present

## 2021-06-27 DIAGNOSIS — M25311 Other instability, right shoulder: Secondary | ICD-10-CM | POA: Diagnosis not present

## 2021-07-27 DIAGNOSIS — L820 Inflamed seborrheic keratosis: Secondary | ICD-10-CM | POA: Diagnosis not present

## 2021-07-27 DIAGNOSIS — D234 Other benign neoplasm of skin of scalp and neck: Secondary | ICD-10-CM | POA: Diagnosis not present

## 2021-08-03 DIAGNOSIS — H6982 Other specified disorders of Eustachian tube, left ear: Secondary | ICD-10-CM | POA: Diagnosis not present

## 2021-08-03 DIAGNOSIS — H938X2 Other specified disorders of left ear: Secondary | ICD-10-CM | POA: Diagnosis not present

## 2021-08-03 DIAGNOSIS — B001 Herpesviral vesicular dermatitis: Secondary | ICD-10-CM | POA: Diagnosis not present

## 2021-08-03 DIAGNOSIS — J302 Other seasonal allergic rhinitis: Secondary | ICD-10-CM | POA: Diagnosis not present

## 2021-09-13 DIAGNOSIS — R7301 Impaired fasting glucose: Secondary | ICD-10-CM | POA: Diagnosis not present

## 2021-09-13 DIAGNOSIS — E785 Hyperlipidemia, unspecified: Secondary | ICD-10-CM | POA: Diagnosis not present

## 2021-09-13 DIAGNOSIS — E049 Nontoxic goiter, unspecified: Secondary | ICD-10-CM | POA: Diagnosis not present

## 2021-11-26 DIAGNOSIS — W540XXA Bitten by dog, initial encounter: Secondary | ICD-10-CM | POA: Diagnosis not present

## 2021-11-26 DIAGNOSIS — M79642 Pain in left hand: Secondary | ICD-10-CM | POA: Diagnosis not present

## 2021-11-26 DIAGNOSIS — S61452A Open bite of left hand, initial encounter: Secondary | ICD-10-CM | POA: Diagnosis not present

## 2021-12-24 DIAGNOSIS — R7301 Impaired fasting glucose: Secondary | ICD-10-CM | POA: Diagnosis not present

## 2021-12-24 DIAGNOSIS — M25561 Pain in right knee: Secondary | ICD-10-CM | POA: Diagnosis not present

## 2021-12-24 DIAGNOSIS — J45998 Other asthma: Secondary | ICD-10-CM | POA: Diagnosis not present

## 2021-12-24 DIAGNOSIS — Z0000 Encounter for general adult medical examination without abnormal findings: Secondary | ICD-10-CM | POA: Diagnosis not present

## 2022-02-28 DIAGNOSIS — D1801 Hemangioma of skin and subcutaneous tissue: Secondary | ICD-10-CM | POA: Diagnosis not present

## 2022-02-28 DIAGNOSIS — L570 Actinic keratosis: Secondary | ICD-10-CM | POA: Diagnosis not present

## 2022-02-28 DIAGNOSIS — L821 Other seborrheic keratosis: Secondary | ICD-10-CM | POA: Diagnosis not present

## 2022-02-28 DIAGNOSIS — L814 Other melanin hyperpigmentation: Secondary | ICD-10-CM | POA: Diagnosis not present

## 2022-05-10 DIAGNOSIS — H5203 Hypermetropia, bilateral: Secondary | ICD-10-CM | POA: Diagnosis not present

## 2022-05-22 DIAGNOSIS — Z13820 Encounter for screening for osteoporosis: Secondary | ICD-10-CM | POA: Diagnosis not present

## 2022-05-22 DIAGNOSIS — Z1231 Encounter for screening mammogram for malignant neoplasm of breast: Secondary | ICD-10-CM | POA: Diagnosis not present

## 2022-05-22 DIAGNOSIS — Z6833 Body mass index (BMI) 33.0-33.9, adult: Secondary | ICD-10-CM | POA: Diagnosis not present

## 2022-05-22 DIAGNOSIS — Z01419 Encounter for gynecological examination (general) (routine) without abnormal findings: Secondary | ICD-10-CM | POA: Diagnosis not present

## 2022-06-06 DIAGNOSIS — Z23 Encounter for immunization: Secondary | ICD-10-CM | POA: Diagnosis not present

## 2023-03-04 DIAGNOSIS — D225 Melanocytic nevi of trunk: Secondary | ICD-10-CM | POA: Diagnosis not present

## 2023-03-04 DIAGNOSIS — D692 Other nonthrombocytopenic purpura: Secondary | ICD-10-CM | POA: Diagnosis not present

## 2023-03-04 DIAGNOSIS — L570 Actinic keratosis: Secondary | ICD-10-CM | POA: Diagnosis not present

## 2023-03-04 DIAGNOSIS — L814 Other melanin hyperpigmentation: Secondary | ICD-10-CM | POA: Diagnosis not present

## 2023-03-04 DIAGNOSIS — L821 Other seborrheic keratosis: Secondary | ICD-10-CM | POA: Diagnosis not present

## 2023-03-11 DIAGNOSIS — E785 Hyperlipidemia, unspecified: Secondary | ICD-10-CM | POA: Diagnosis not present

## 2023-03-11 DIAGNOSIS — E049 Nontoxic goiter, unspecified: Secondary | ICD-10-CM | POA: Diagnosis not present

## 2023-03-11 DIAGNOSIS — R7301 Impaired fasting glucose: Secondary | ICD-10-CM | POA: Diagnosis not present

## 2023-03-18 DIAGNOSIS — E049 Nontoxic goiter, unspecified: Secondary | ICD-10-CM | POA: Diagnosis not present

## 2023-03-18 DIAGNOSIS — Z1212 Encounter for screening for malignant neoplasm of rectum: Secondary | ICD-10-CM | POA: Diagnosis not present

## 2023-03-18 DIAGNOSIS — Z1331 Encounter for screening for depression: Secondary | ICD-10-CM | POA: Diagnosis not present

## 2023-03-18 DIAGNOSIS — Z23 Encounter for immunization: Secondary | ICD-10-CM | POA: Diagnosis not present

## 2023-03-18 DIAGNOSIS — R82998 Other abnormal findings in urine: Secondary | ICD-10-CM | POA: Diagnosis not present

## 2023-03-18 DIAGNOSIS — Z0000 Encounter for general adult medical examination without abnormal findings: Secondary | ICD-10-CM | POA: Diagnosis not present

## 2023-03-18 DIAGNOSIS — Z1339 Encounter for screening examination for other mental health and behavioral disorders: Secondary | ICD-10-CM | POA: Diagnosis not present

## 2023-05-14 DIAGNOSIS — H524 Presbyopia: Secondary | ICD-10-CM | POA: Diagnosis not present

## 2023-05-20 DIAGNOSIS — H524 Presbyopia: Secondary | ICD-10-CM | POA: Diagnosis not present

## 2023-05-28 DIAGNOSIS — Z1231 Encounter for screening mammogram for malignant neoplasm of breast: Secondary | ICD-10-CM | POA: Diagnosis not present

## 2023-05-28 DIAGNOSIS — Z124 Encounter for screening for malignant neoplasm of cervix: Secondary | ICD-10-CM | POA: Diagnosis not present

## 2023-05-28 DIAGNOSIS — Z01419 Encounter for gynecological examination (general) (routine) without abnormal findings: Secondary | ICD-10-CM | POA: Diagnosis not present

## 2023-05-28 DIAGNOSIS — Z6834 Body mass index (BMI) 34.0-34.9, adult: Secondary | ICD-10-CM | POA: Diagnosis not present

## 2023-06-02 DIAGNOSIS — N390 Urinary tract infection, site not specified: Secondary | ICD-10-CM | POA: Diagnosis not present

## 2023-08-26 DIAGNOSIS — S41112A Laceration without foreign body of left upper arm, initial encounter: Secondary | ICD-10-CM | POA: Diagnosis not present

## 2024-03-03 DIAGNOSIS — D225 Melanocytic nevi of trunk: Secondary | ICD-10-CM | POA: Diagnosis not present

## 2024-03-03 DIAGNOSIS — D692 Other nonthrombocytopenic purpura: Secondary | ICD-10-CM | POA: Diagnosis not present

## 2024-03-03 DIAGNOSIS — L821 Other seborrheic keratosis: Secondary | ICD-10-CM | POA: Diagnosis not present

## 2024-03-03 DIAGNOSIS — L820 Inflamed seborrheic keratosis: Secondary | ICD-10-CM | POA: Diagnosis not present

## 2024-03-03 DIAGNOSIS — L814 Other melanin hyperpigmentation: Secondary | ICD-10-CM | POA: Diagnosis not present

## 2024-03-18 DIAGNOSIS — E785 Hyperlipidemia, unspecified: Secondary | ICD-10-CM | POA: Diagnosis not present

## 2024-03-18 DIAGNOSIS — R7301 Impaired fasting glucose: Secondary | ICD-10-CM | POA: Diagnosis not present

## 2024-03-18 DIAGNOSIS — E049 Nontoxic goiter, unspecified: Secondary | ICD-10-CM | POA: Diagnosis not present

## 2024-03-25 DIAGNOSIS — Z23 Encounter for immunization: Secondary | ICD-10-CM | POA: Diagnosis not present

## 2024-03-25 DIAGNOSIS — Z1331 Encounter for screening for depression: Secondary | ICD-10-CM | POA: Diagnosis not present

## 2024-03-25 DIAGNOSIS — Z1339 Encounter for screening examination for other mental health and behavioral disorders: Secondary | ICD-10-CM | POA: Diagnosis not present

## 2024-03-25 DIAGNOSIS — Z0000 Encounter for general adult medical examination without abnormal findings: Secondary | ICD-10-CM | POA: Diagnosis not present

## 2024-03-25 DIAGNOSIS — E049 Nontoxic goiter, unspecified: Secondary | ICD-10-CM | POA: Diagnosis not present
